# Patient Record
Sex: Female | Born: 1955 | Race: White | Hispanic: No | Marital: Married
Health system: Midwestern US, Academic
[De-identification: ages and names within clinical notes are randomized; demographics above are authoritative.]

---

## 2016-03-30 IMAGING — US US RETROPERITONEAL LIMITED
1 series · 14 of 25 positions shown · non-contrast
Comparison: none

[Series 1: us retroperitoneal limited · 14 of 48 slices shown]
[im 1/48]
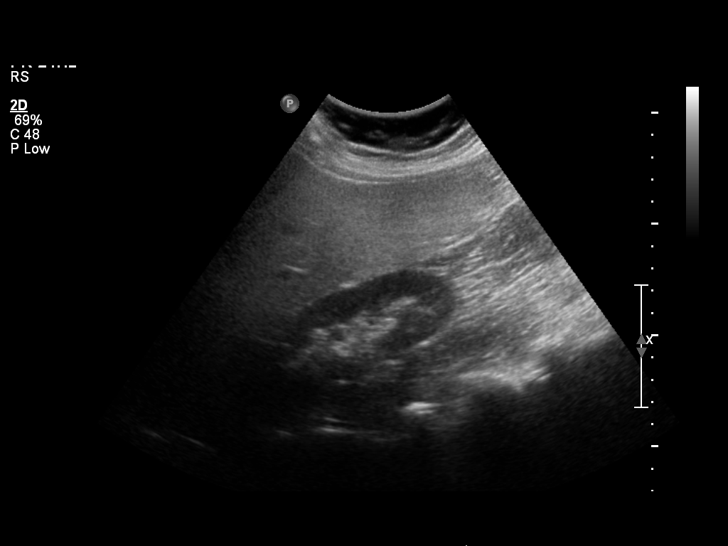
[im 4/48]
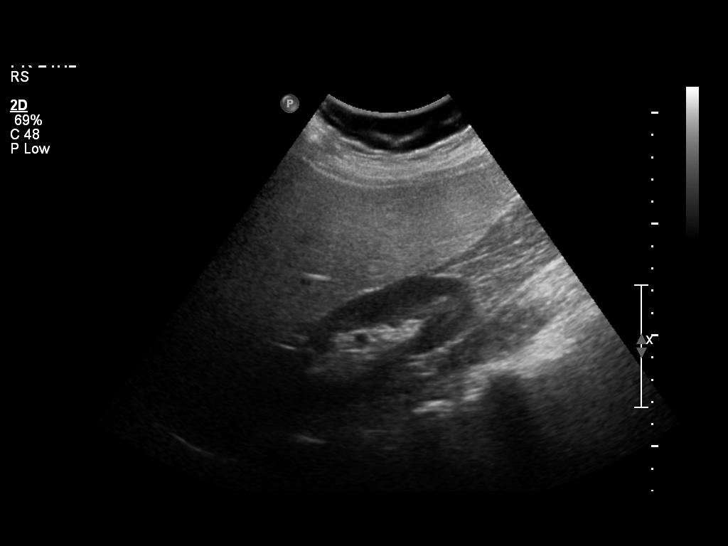
[im 8/48]
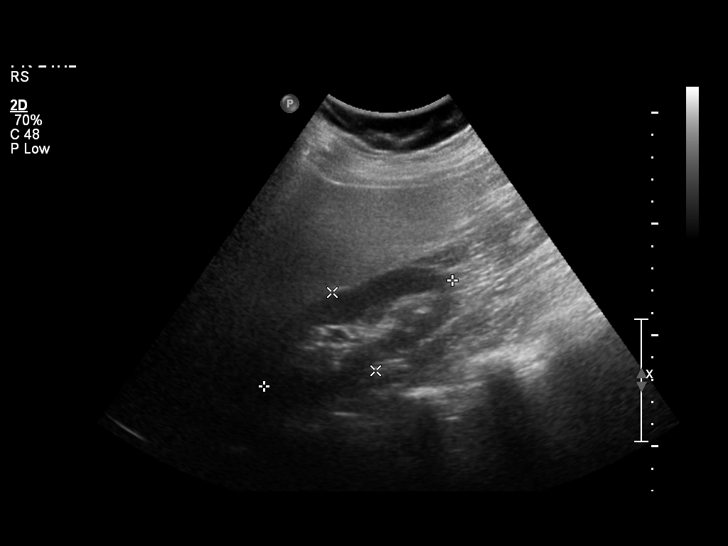
[im 12/48]
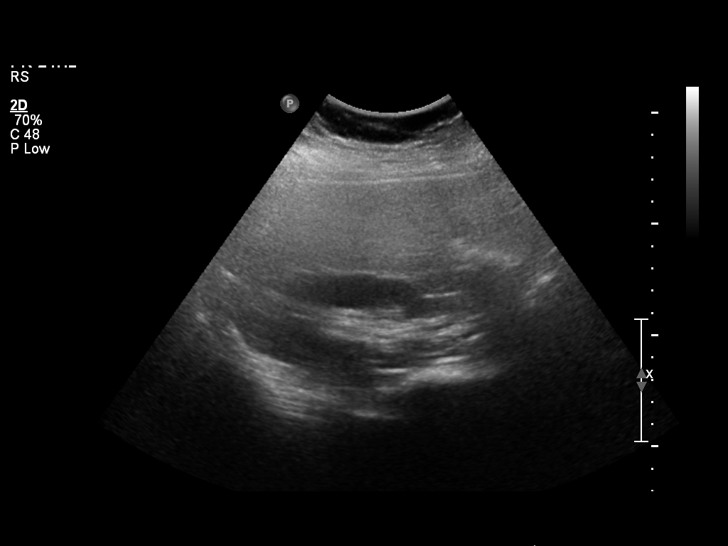
[im 16/48]
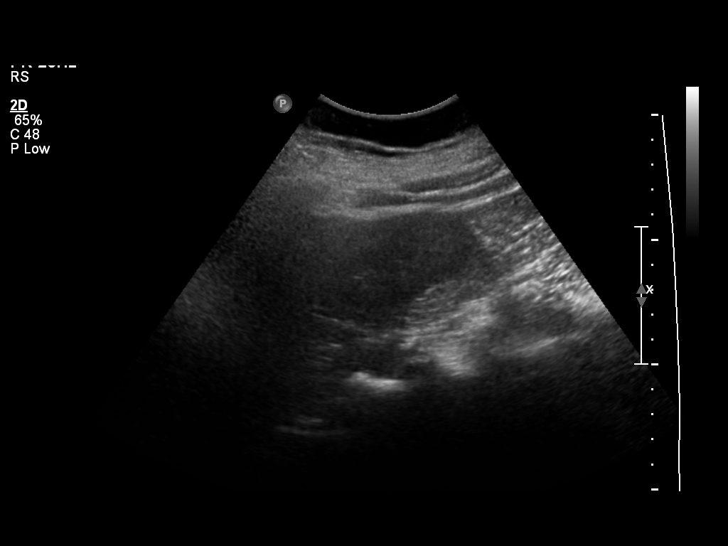
[im 18/48]
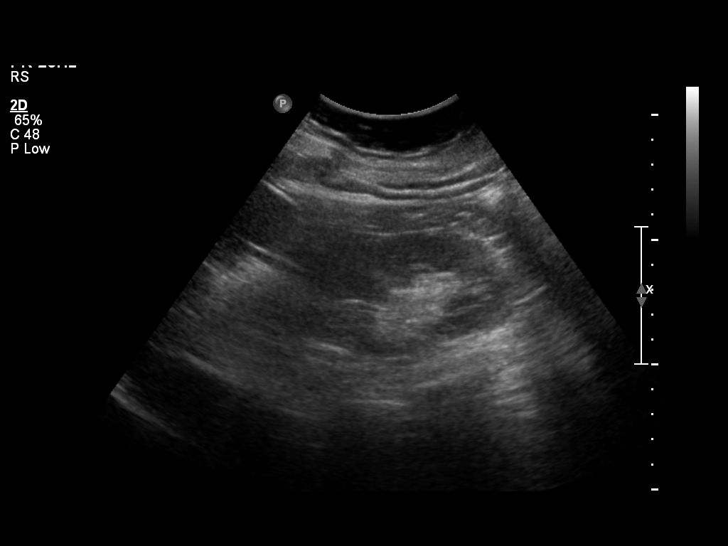
[im 22/48]
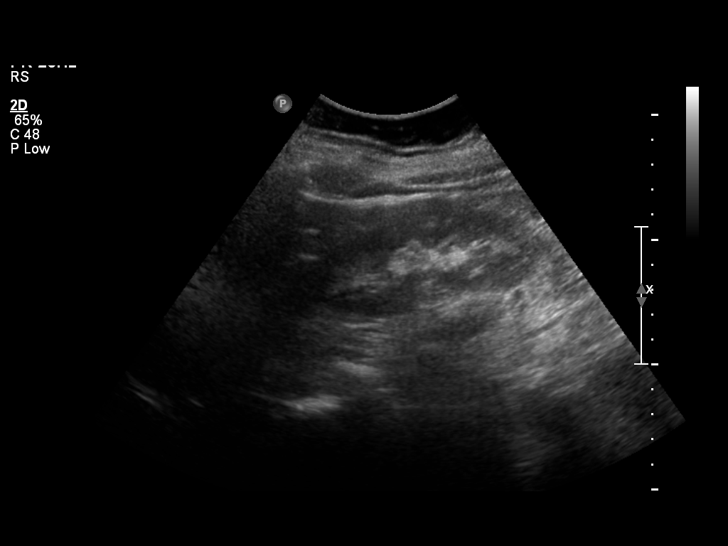
[im 26/48]
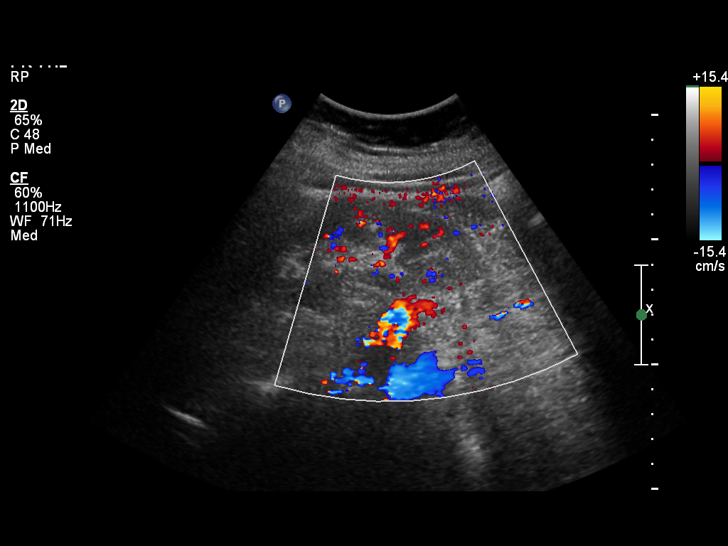
[im 30/48]
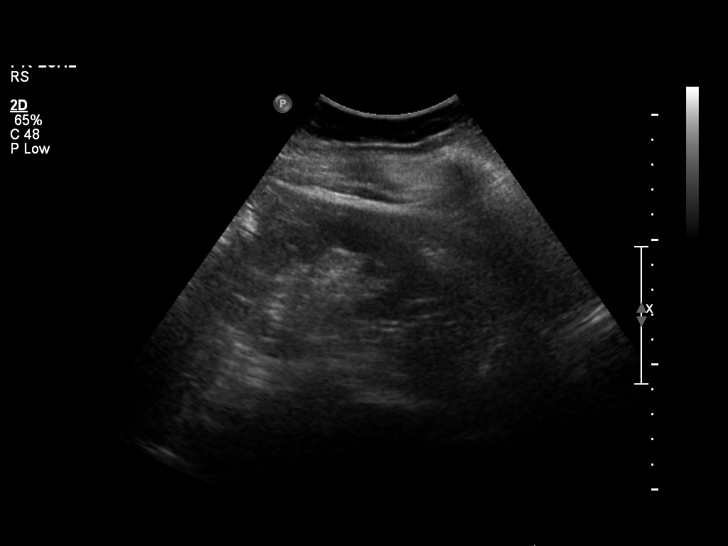
[im 32/48]
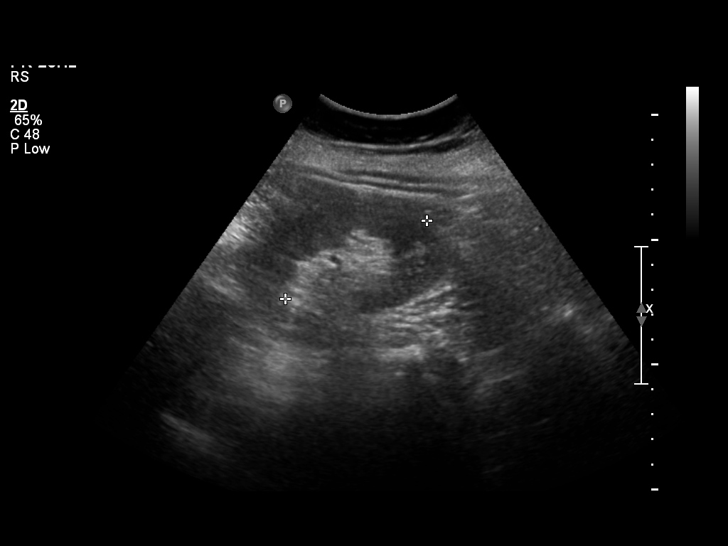
[im 36/48]
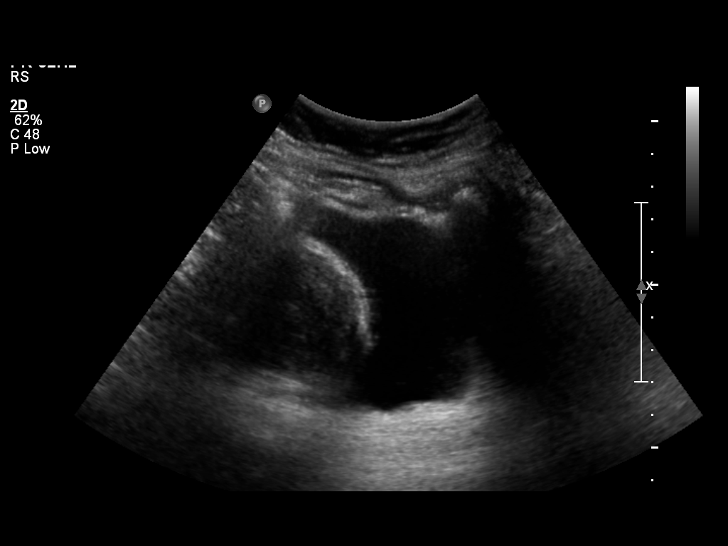
[im 40/48]
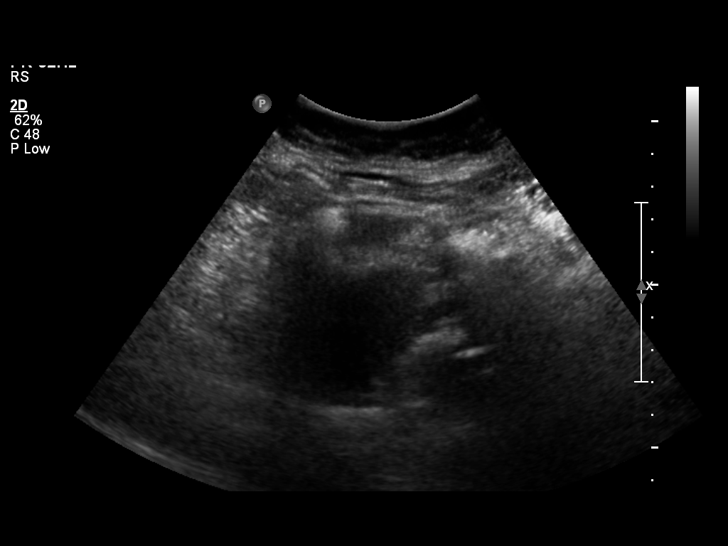
[im 44/48]
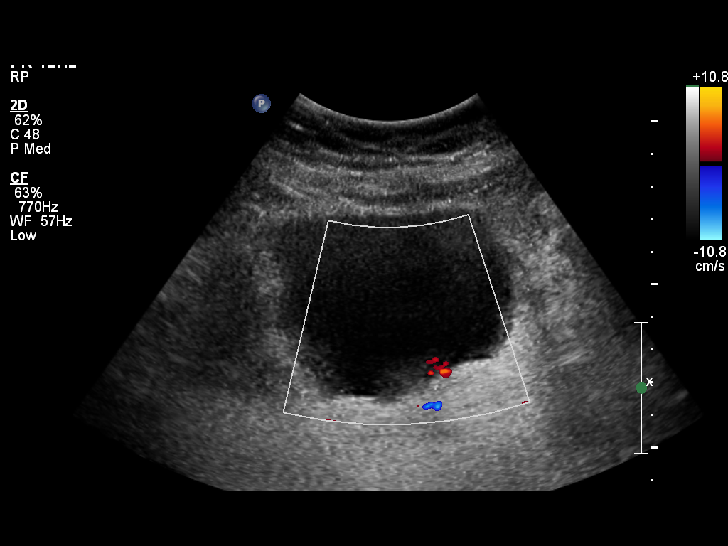
[im 48/48]
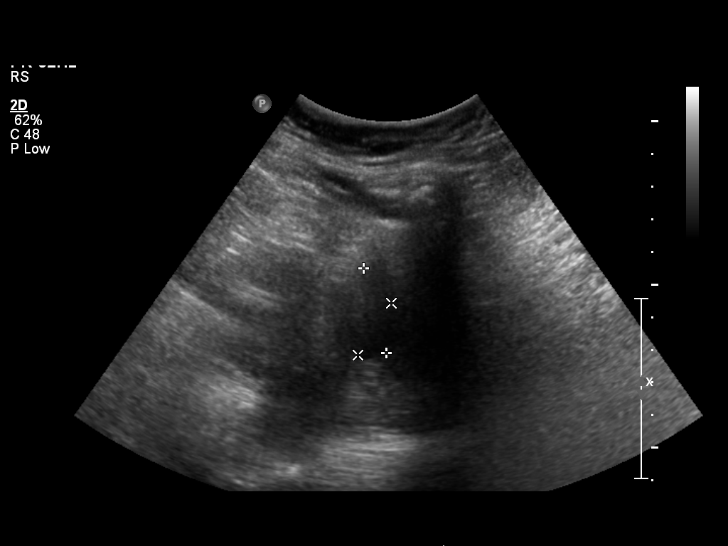

[14 of 25 positions shown; findings below may reference images not displayed]

ULTRASOUND REPORT

DIAGNOSTIC STUDIES

EXAM
Ultrasound kidneys and bladder

INDICATION
Hematuria. Bladder spasms

TECHNIQUE
Grayscale and color Doppler imaging of the kidneys and bladder was performed

COMPARISONS
None

FINDINGS
Right kidney 9.7 x 4.1 x 5.7 centimeter and left kidney 11.4 x 5.3 x 6.5 centimeter. Normal
thickness cortices. No hydronephrosis or shadowing nephrolithiasis.
The bladder is distended without focal mural nodularity or thickening. Prevoid volume 101
milliliter and postvoid volume 10 milliliter. Bilateral ureteral vesicular jets are evident.

IMPRESSION
No sonographic abnormalities of the kidneys or bladder.

## 2016-06-16 IMAGING — CR CHEST
2 series · 2 of 2 positions shown · non-contrast
Comparison: none

[chest pa x-wise]
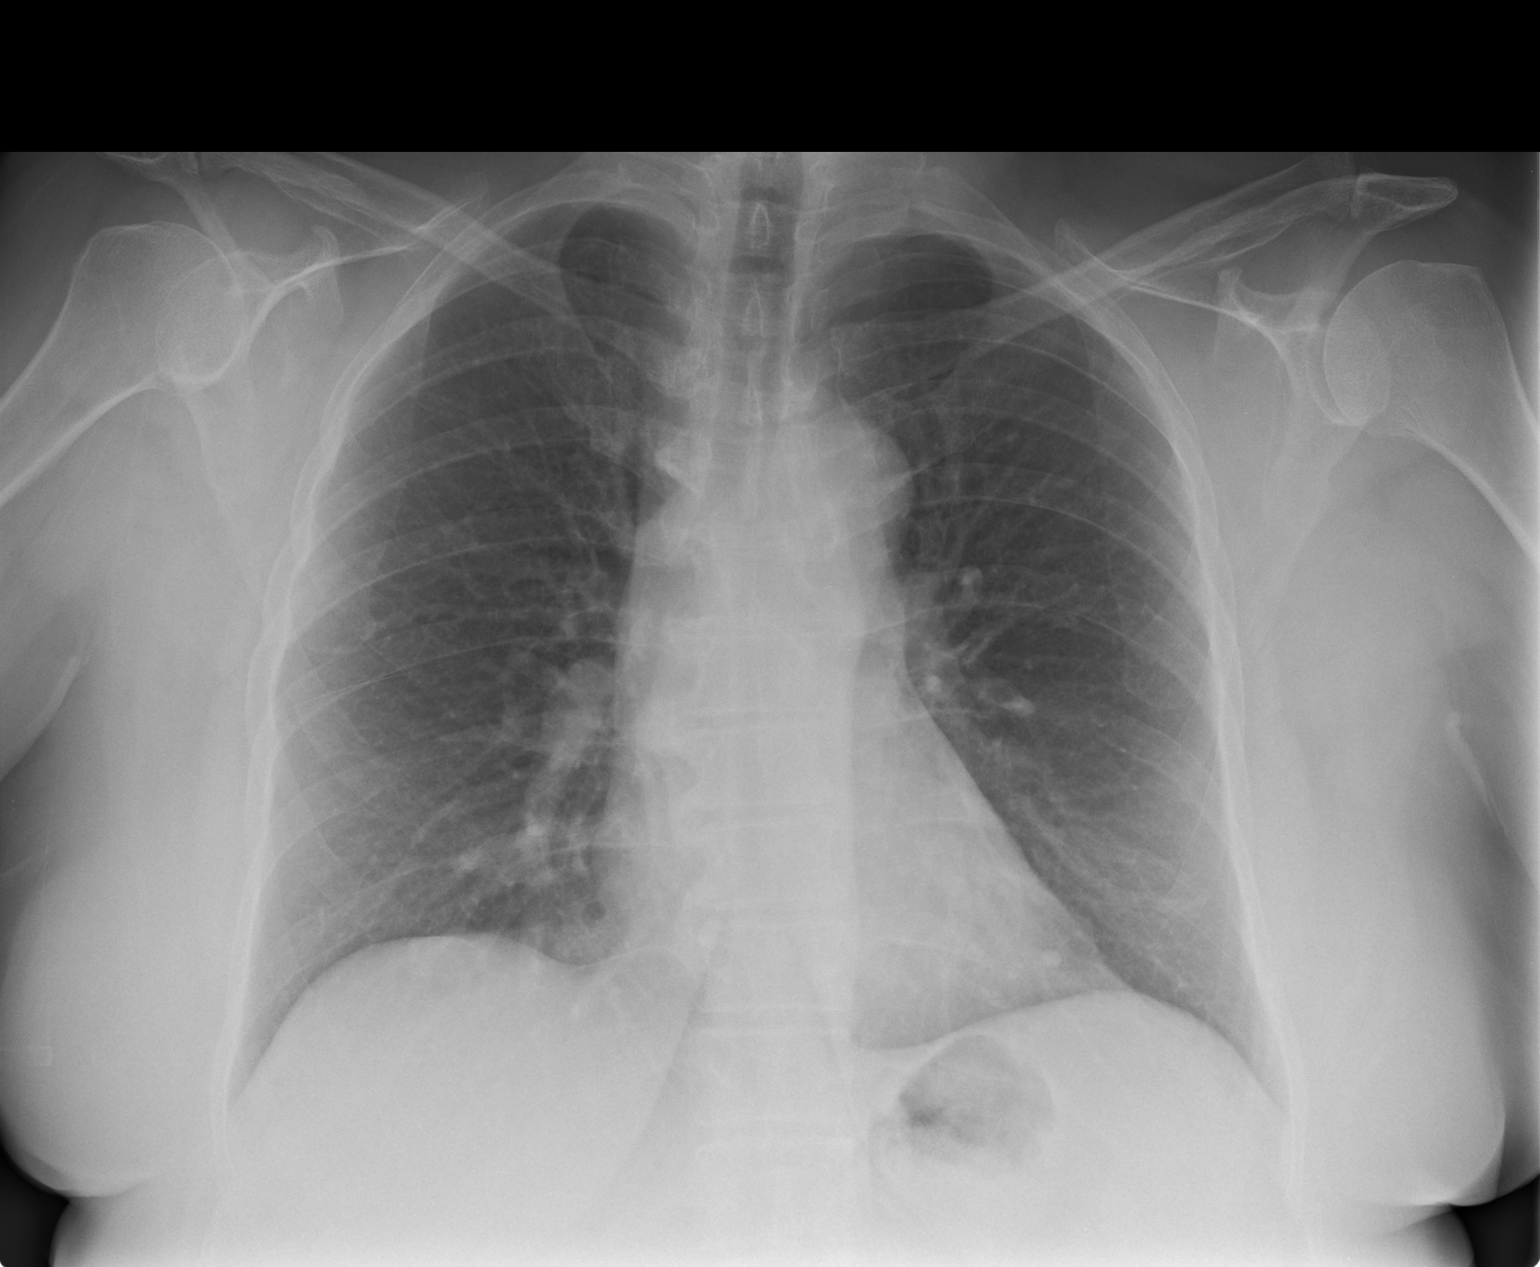

[chest lat]
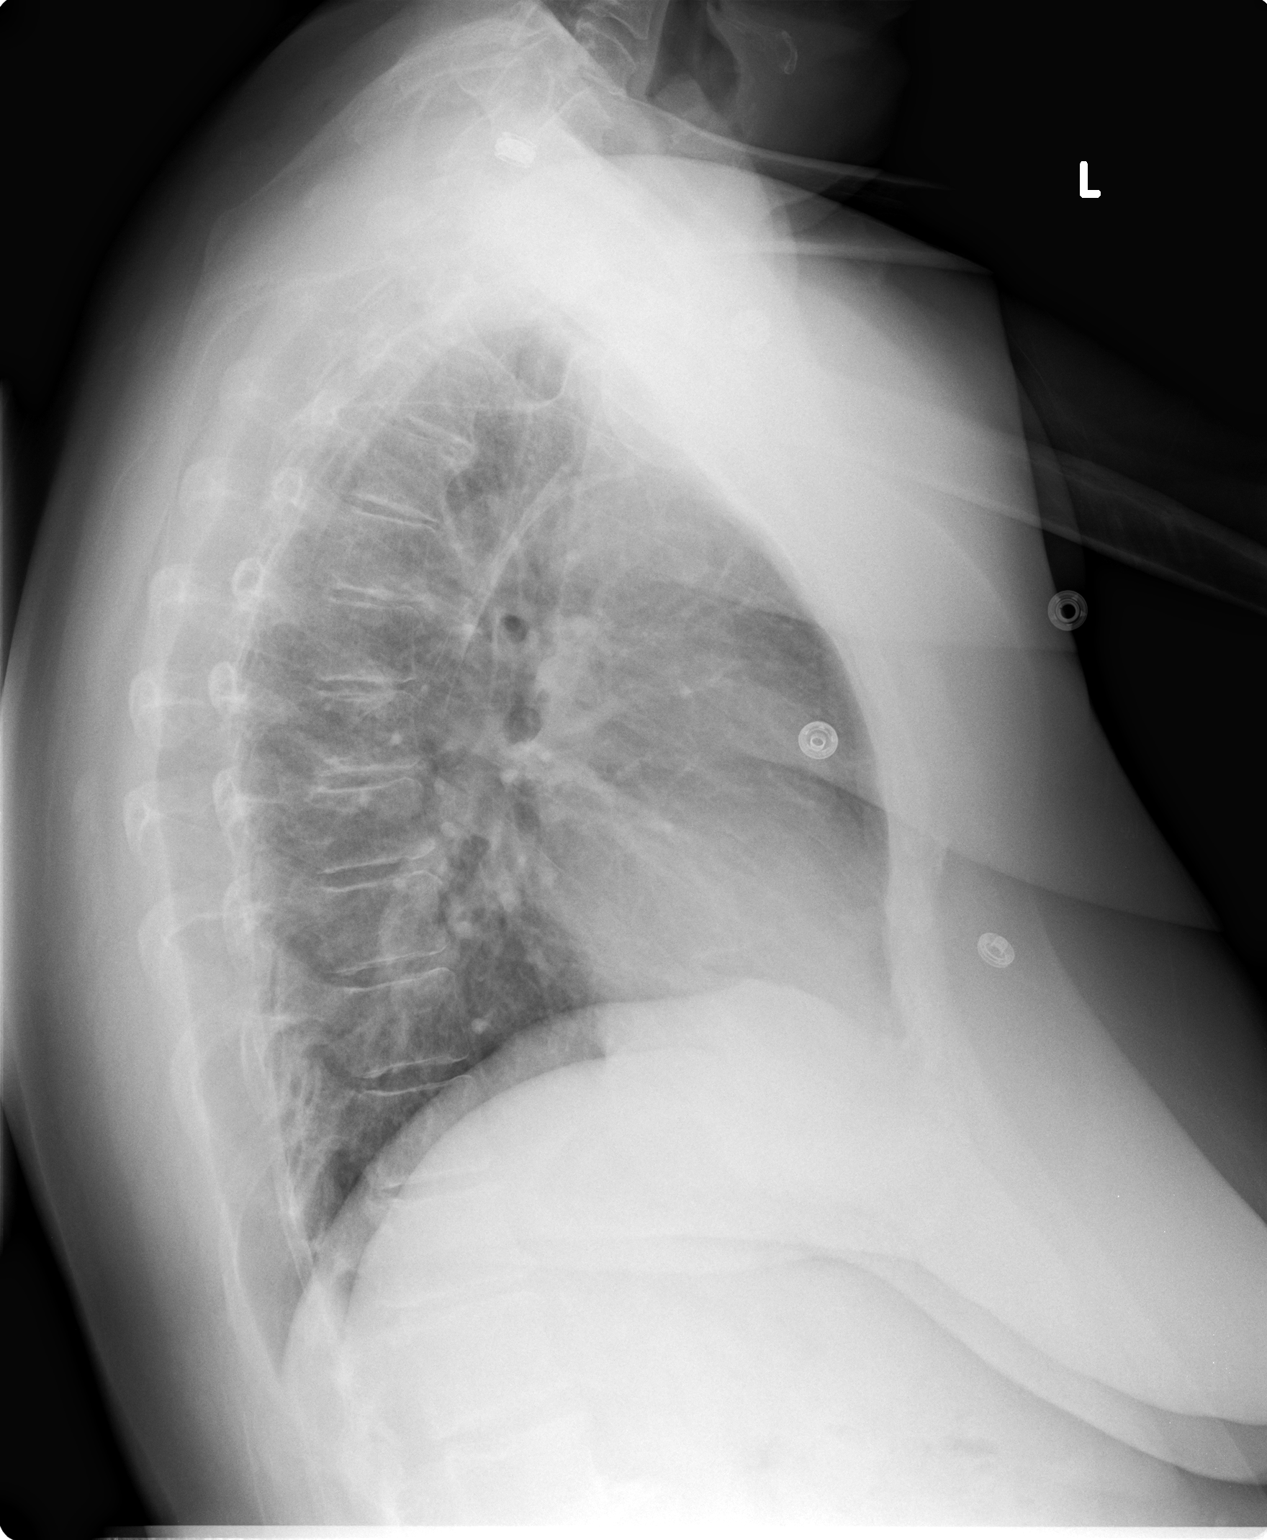

[2 of 2 positions shown; findings below may reference images not displayed]

EXAM

RADIOLOGICAL EXAMINATION, CHEST; 2 VIEWS FRONTAL AND LATERAL CPT 86343

INDICATION

cough fever
DYSPNEA W/FEVER AND CHILLS X 1 DAY. DENIES CHRONIC HEART OR LUNG
CONDITIONS. HB

TECHNIQUE

2 views of the chest were acquired.

COMPARISONS

None

FINDINGS

The cardiac silhouette is within normal limits. The lungs are clear. The pulmonary vasculature is
normal in caliber.

IMPRESSION

No acute cardiopulmonary process. Normal chest 2 views.

## 2016-08-15 IMAGING — CR CHEST
2 series · 2 of 2 positions shown · non-contrast
Comparison: none

[chest pa]
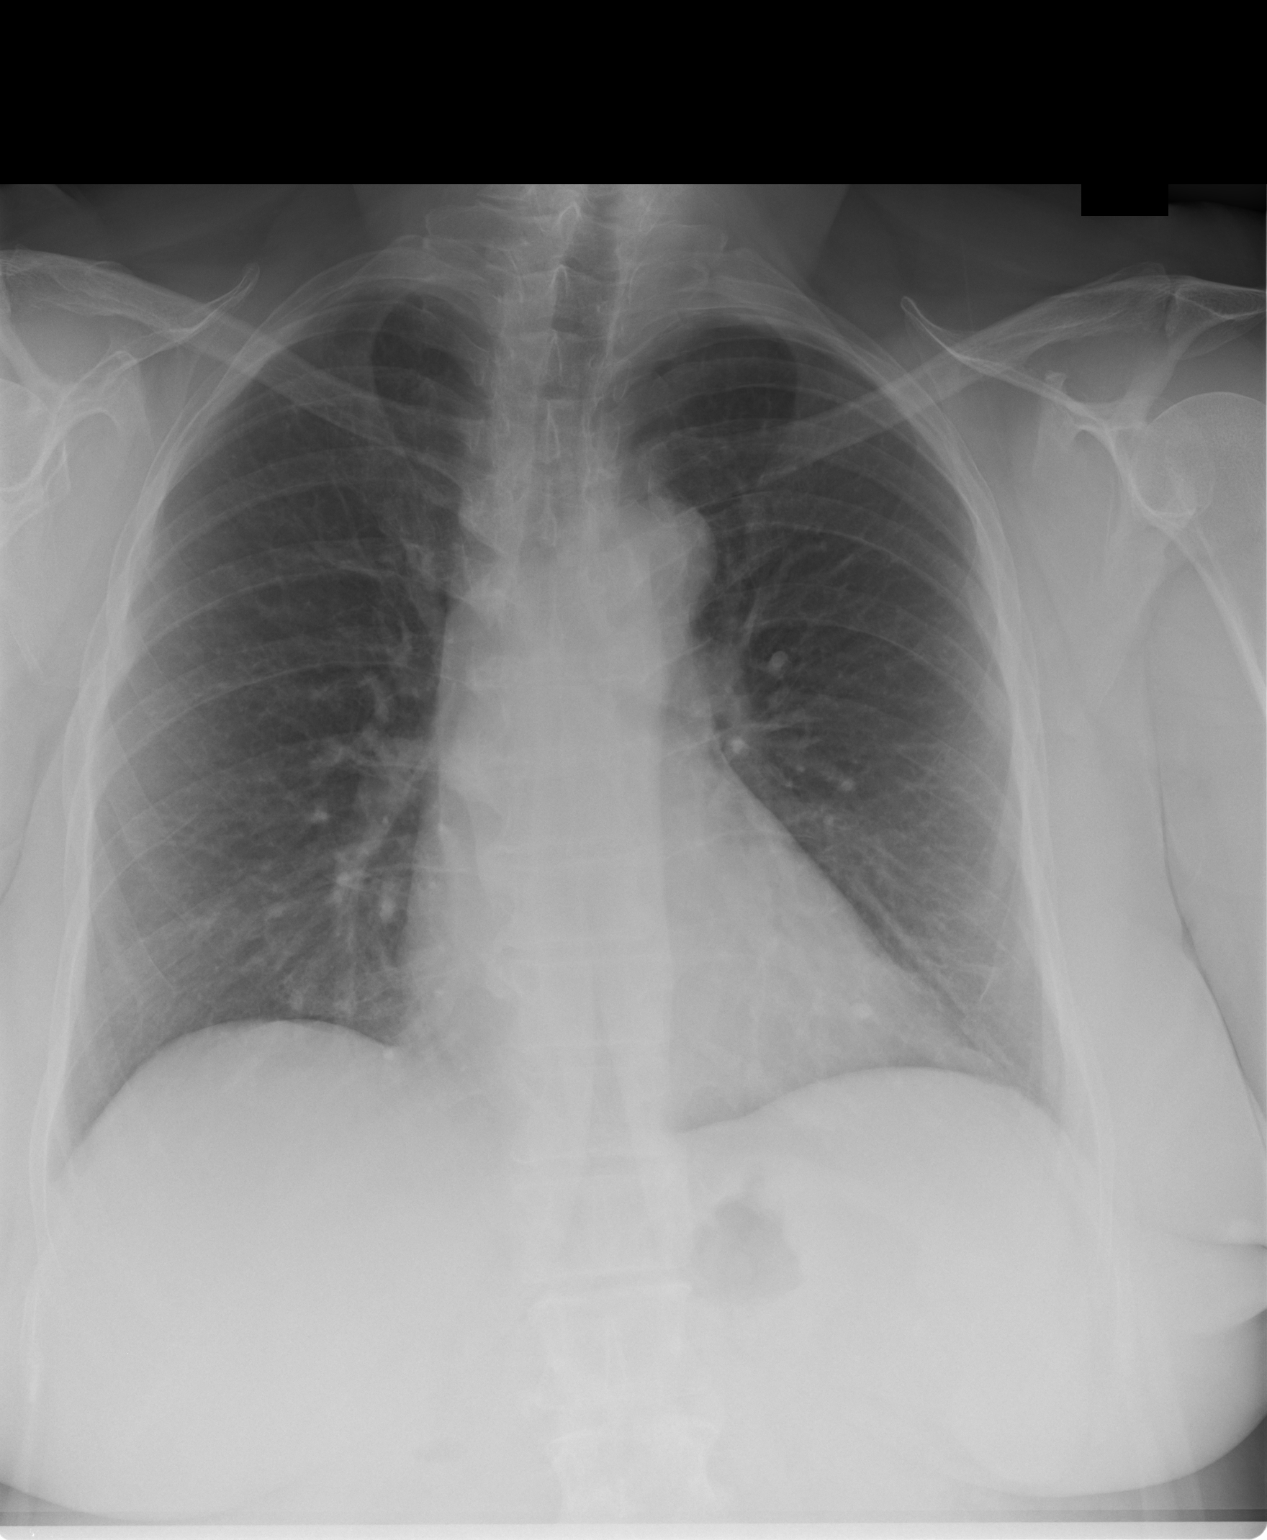

[chest lat]
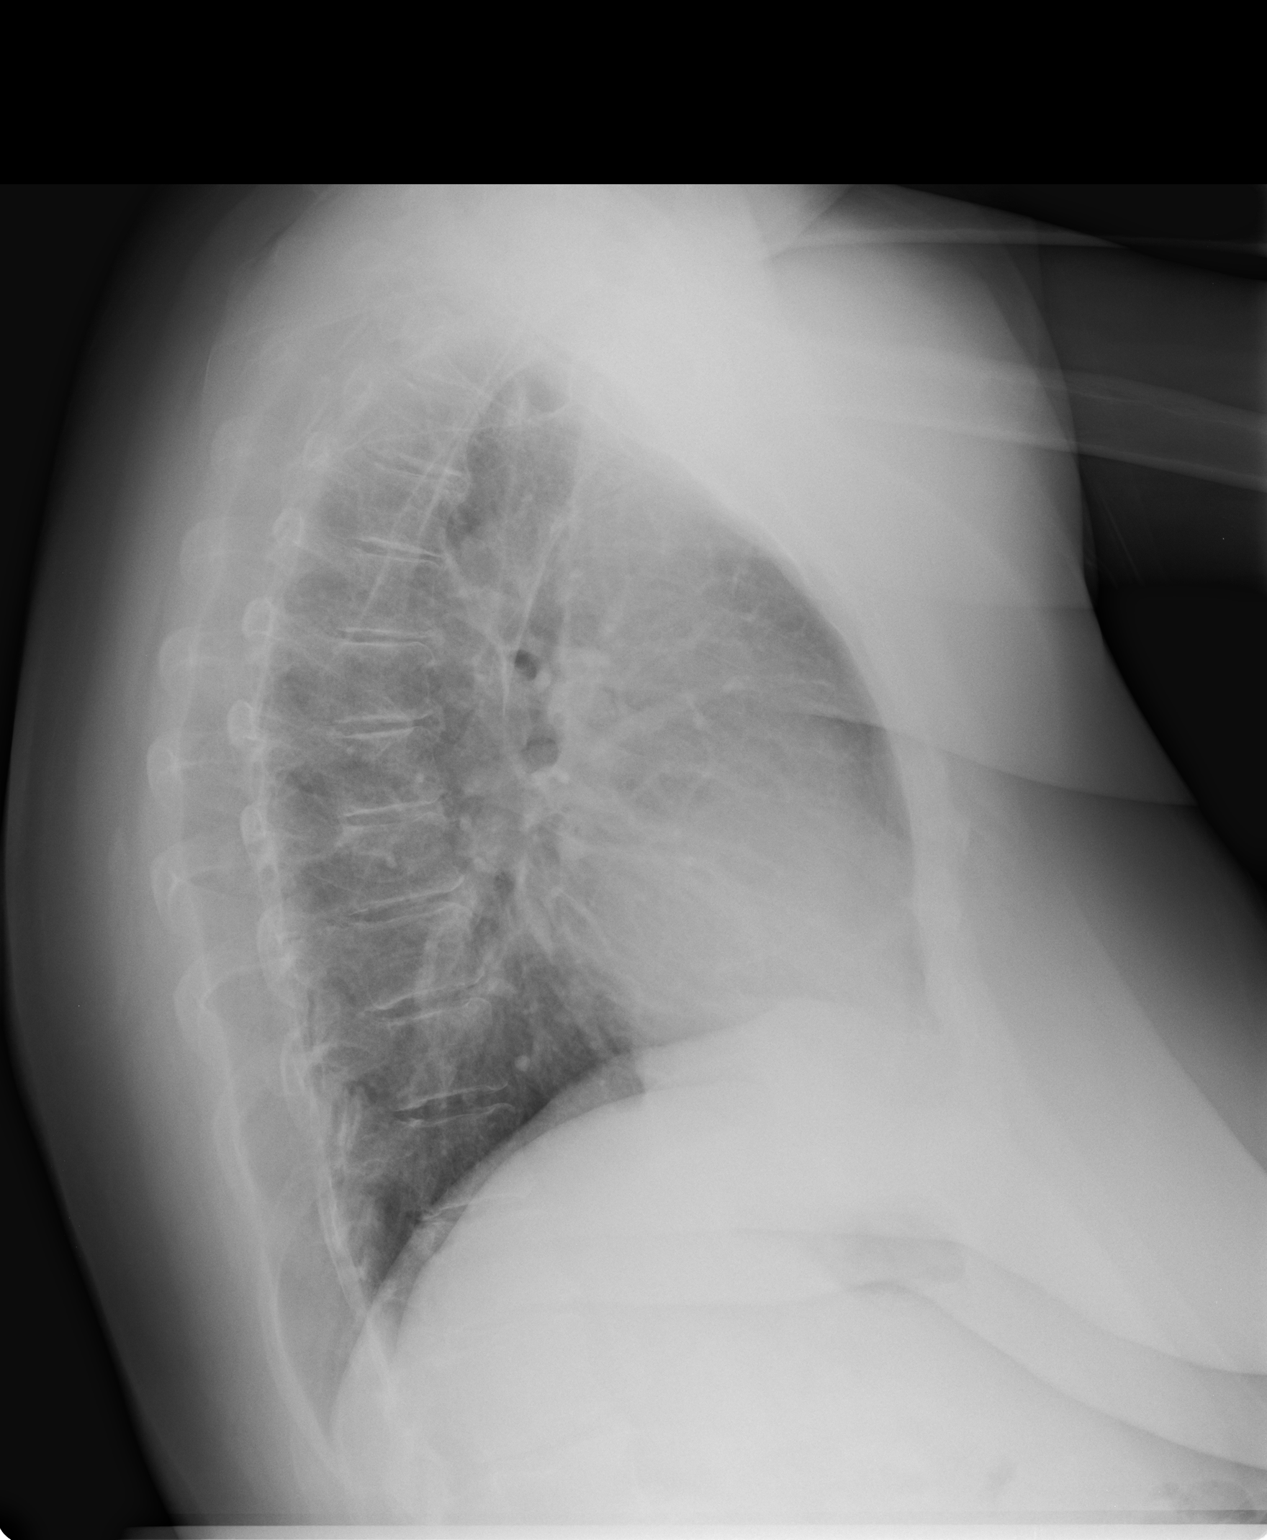

[2 of 2 positions shown; findings below may reference images not displayed]

EXAM
RADIOLOGICAL EXAMINATION, CHEST; 2 VIEWS FRONTAL AND LATERAL CPT 57787

INDICATION
SOB, MID BACK PAIN
PT STATES HAS SOA, COUGH. NO PRIOR CHEST HX. TJ/[REDACTED]

TECHNIQUE
2 views of the chest were acquired.

COMPARISONS
Previous examination dated 06/16/2016.

FINDINGS
The cardiac silhouette is within normal limits. The lungs are clear. The pulmonary vasculature is
normal in caliber.

IMPRESSION
No acute cardiopulmonary process. Normal chest 2 views.

## 2017-11-14 IMAGING — CR LOW_EXM
2 series · 2 of 2 positions shown · non-contrast
Comparison: none

[knee ap]
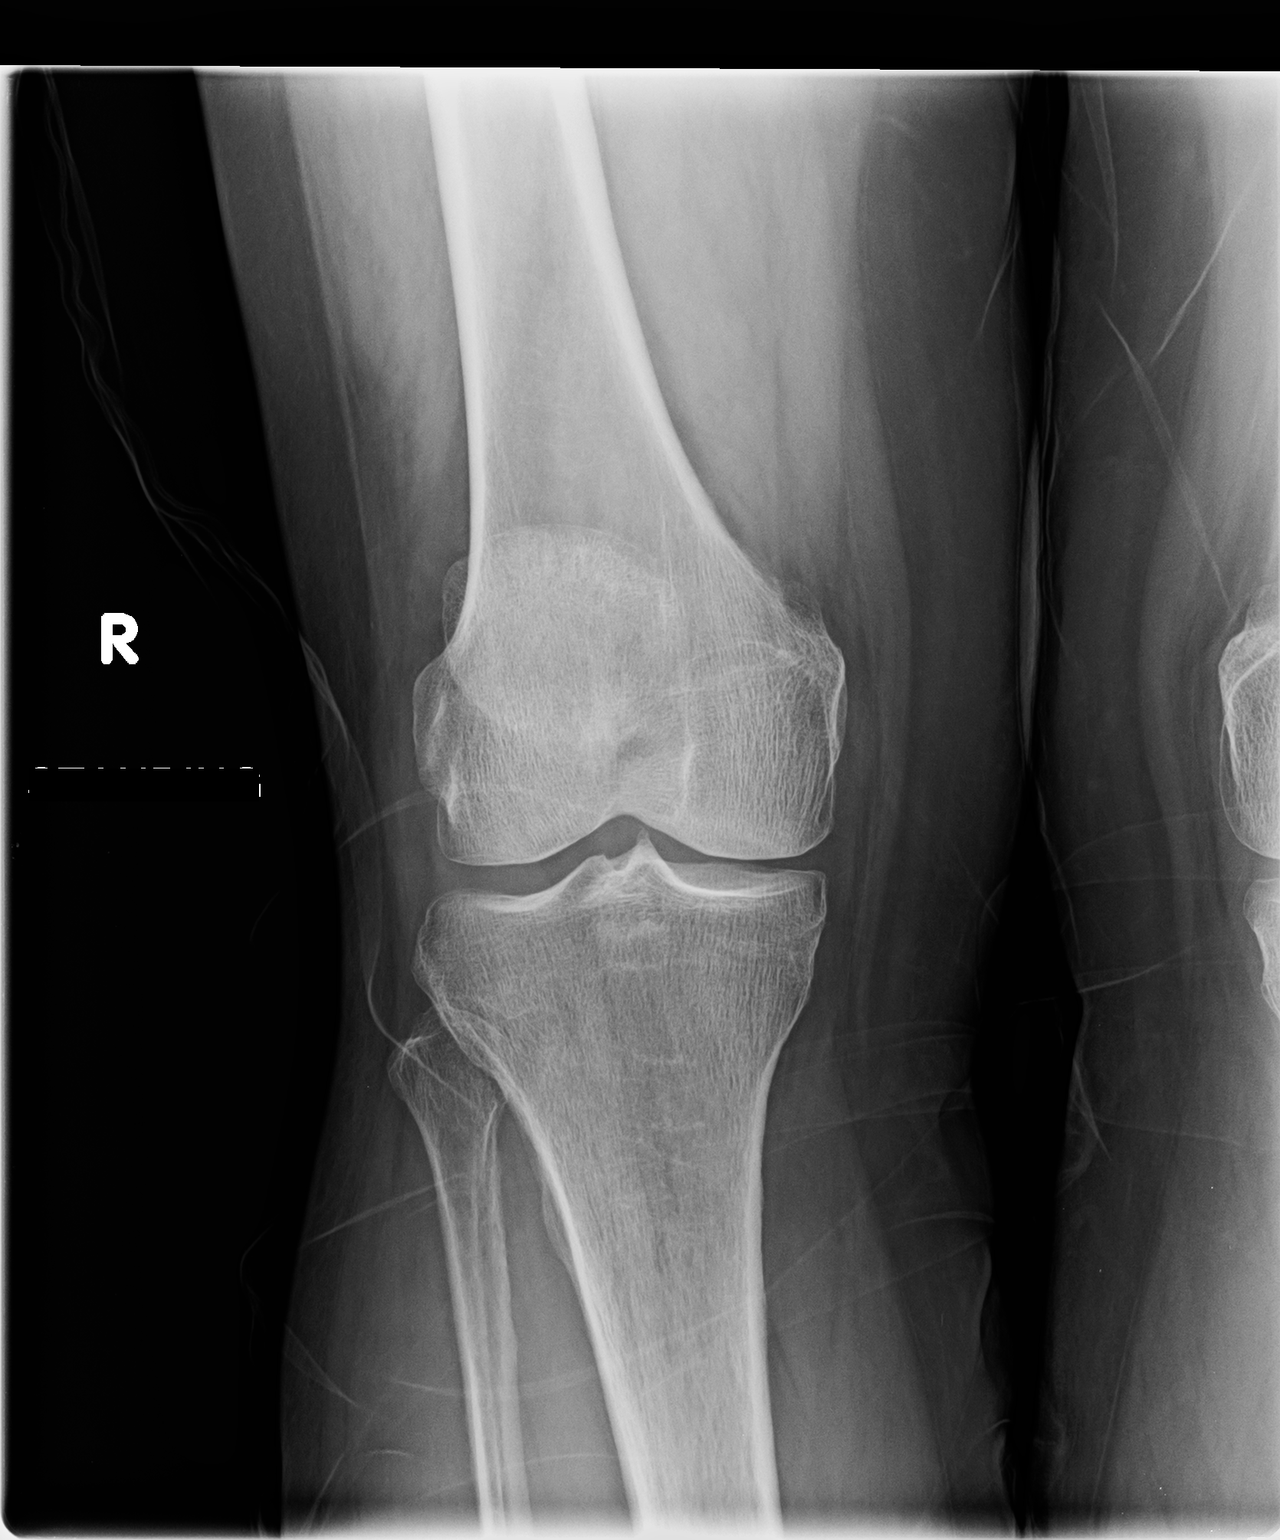

[knee lat]
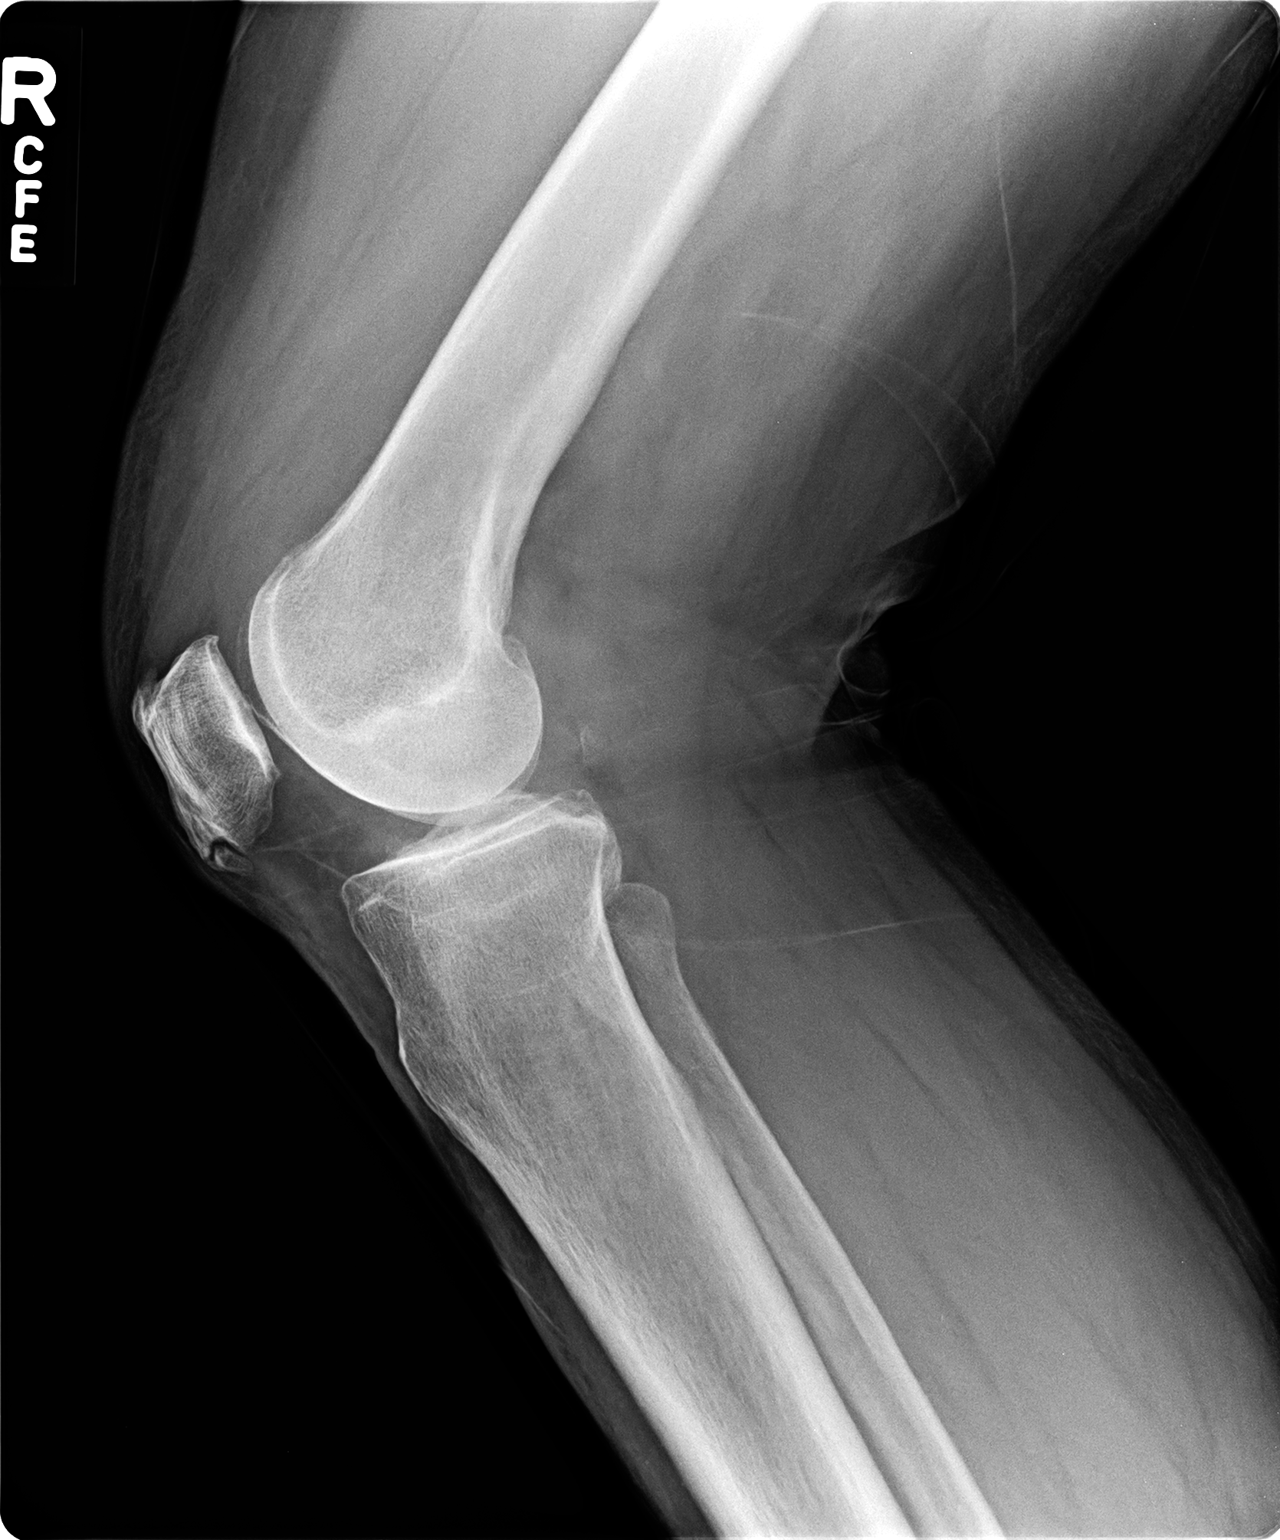

[2 of 2 positions shown; findings below may reference images not displayed]

DIAGNOSTIC STUDIES

EXAM

Right knee radiographs.

INDICATION

RT KNEE PAIN
PT C/O RIGHT KNEE PAIN. NKI. TJ/CE

TECHNIQUE

AP standing and lateral views of the right knee.

COMPARISONS

None.

FINDINGS

There is mild joint space narrowing of the medial compartment with slight marginal spurring of the
anterior compartment also noted. There is evidence of a small to moderate joint effusion.
Enthesopathy at the superior and inferior patella with partial fragmentation inferiorly, though
corticated margins suggesting that this is a chronic process. There is no gross disruption of the
extensor mechanism of the knee. No compelling evidence acutely displaced fracture. There is a small
sessile osteochondroma at the medial aspect of the distal femoral metaphysis.

IMPRESSION

No acute osseous abnormality. Joint effusion. Mild degenerative changes.

## 2018-03-03 ENCOUNTER — Ambulatory Visit: Admit: 2018-03-03 | Discharge: 2018-03-03 | Payer: BC Managed Care – HMO

## 2018-03-03 ENCOUNTER — Encounter: Admit: 2018-03-03 | Discharge: 2018-03-03 | Payer: BC Managed Care – HMO

## 2018-03-03 DIAGNOSIS — E119 Type 2 diabetes mellitus without complications: Principal | ICD-10-CM

## 2018-03-03 DIAGNOSIS — O009 Unspecified ectopic pregnancy without intrauterine pregnancy: ICD-10-CM

## 2018-03-03 DIAGNOSIS — F419 Anxiety disorder, unspecified: ICD-10-CM

## 2018-03-03 DIAGNOSIS — R030 Elevated blood-pressure reading, without diagnosis of hypertension: ICD-10-CM

## 2018-03-03 MED ORDER — METFORMIN 500 MG PO TAB
500 mg | ORAL_TABLET | Freq: Two times a day (BID) | ORAL | 0 refills | Status: AC
Start: 2018-03-03 — End: 2018-06-04

## 2018-03-03 MED ORDER — BLOOD SUGAR DIAGNOSTIC MISC STRP
1 | ORAL_STRIP | Freq: Before meals | 3 refills | 30.00000 days | Status: AC
Start: 2018-03-03 — End: ?

## 2018-03-17 LAB — MICROALBUMIN-URINE RANDOM
Lab: 10 mg/g (ref <30)
Lab: 169 mg/dL — AB (ref 47–110)
Lab: 18 mg/L — ABNORMAL LOW (ref >60)

## 2018-03-18 ENCOUNTER — Encounter: Admit: 2018-03-18 | Discharge: 2018-03-18 | Payer: BC Managed Care – HMO

## 2018-03-18 DIAGNOSIS — E119 Type 2 diabetes mellitus without complications: Principal | ICD-10-CM

## 2018-03-19 ENCOUNTER — Encounter: Admit: 2018-03-19 | Discharge: 2018-03-19 | Payer: BC Managed Care – HMO

## 2018-03-19 DIAGNOSIS — E119 Type 2 diabetes mellitus without complications: Principal | ICD-10-CM

## 2018-03-19 LAB — CBC
Lab: 14 g/dL — ABNORMAL LOW (ref 12.0–16.0)
Lab: 226 x10-3/uL — ABNORMAL LOW (ref 130–400)
Lab: 26 pg — ABNORMAL LOW (ref 27.0–31.0)
Lab: 47 % — ABNORMAL HIGH (ref 37.0–47.00)
Lab: 5.5 10*6/uL — ABNORMAL HIGH (ref 4.20–5.40)
Lab: 6.2 x10-3/uL (ref 4.8–10.8)
Lab: 86 fL (ref 80.0–99.0)

## 2018-03-19 LAB — COMPREHENSIVE METABOLIC PANEL
Lab: 0.8 mg/dL (ref 0.57–1.11)
Lab: 106 mmol/L (ref 98–107)
Lab: 12 mg/dL (ref 9.8–20.1)
Lab: 140 mmol/L — ABNORMAL HIGH (ref <100)
Lab: 162 mg/dL — ABNORMAL HIGH (ref 80–115)
Lab: 23 mmol/L (ref 23–31)
Lab: 4.3 mmol/L (ref 3.5–5.1)

## 2018-03-19 LAB — LIPID PROFILE
Lab: 152 mg/dL (ref 30–200)
Lab: 207 mg/dL — ABNORMAL HIGH (ref 150–200)
Lab: 43 mg/dL (ref 35–60)

## 2018-03-19 LAB — TSH WITH FREE T4 REFLEX: Lab: 1.6 m[IU]/mL (ref >60)

## 2018-06-03 ENCOUNTER — Encounter: Admit: 2018-06-03 | Discharge: 2018-06-03 | Payer: BC Managed Care – HMO

## 2018-06-03 ENCOUNTER — Ambulatory Visit: Admit: 2018-06-03 | Discharge: 2018-06-04 | Payer: BC Managed Care – HMO

## 2018-06-03 DIAGNOSIS — O009 Unspecified ectopic pregnancy without intrauterine pregnancy: ICD-10-CM

## 2018-06-03 DIAGNOSIS — E119 Type 2 diabetes mellitus without complications: Principal | ICD-10-CM

## 2018-06-03 MED ORDER — SITAGLIPTIN-METFORMIN 50-1,000 MG PO TM24
2 | ORAL_TABLET | Freq: Every day | ORAL | 3 refills | Status: AC
Start: 2018-06-03 — End: 2018-10-31

## 2018-06-04 DIAGNOSIS — E119 Type 2 diabetes mellitus without complications: Principal | ICD-10-CM

## 2018-06-04 DIAGNOSIS — E782 Mixed hyperlipidemia: ICD-10-CM

## 2018-10-31 ENCOUNTER — Ambulatory Visit: Admit: 2018-10-31 | Discharge: 2018-11-01 | Payer: BC Managed Care – HMO

## 2018-10-31 ENCOUNTER — Encounter: Admit: 2018-10-31 | Discharge: 2018-10-31 | Payer: BC Managed Care – HMO

## 2018-10-31 DIAGNOSIS — E119 Type 2 diabetes mellitus without complications: Secondary | ICD-10-CM

## 2018-10-31 DIAGNOSIS — O009 Unspecified ectopic pregnancy without intrauterine pregnancy: Secondary | ICD-10-CM

## 2018-10-31 MED ORDER — SITAGLIPTIN-METFORMIN 50-1,000 MG PO TM24
2 | ORAL_TABLET | Freq: Every day | ORAL | 3 refills | Status: AC
Start: 2018-10-31 — End: 2019-11-11

## 2018-11-01 DIAGNOSIS — E119 Type 2 diabetes mellitus without complications: Secondary | ICD-10-CM

## 2018-11-01 DIAGNOSIS — Z1211 Encounter for screening for malignant neoplasm of colon: Secondary | ICD-10-CM

## 2018-11-04 ENCOUNTER — Encounter: Admit: 2018-11-04 | Discharge: 2018-11-04 | Payer: BC Managed Care – HMO

## 2018-11-04 DIAGNOSIS — O009 Unspecified ectopic pregnancy without intrauterine pregnancy: Secondary | ICD-10-CM

## 2018-11-04 DIAGNOSIS — E119 Type 2 diabetes mellitus without complications: Secondary | ICD-10-CM

## 2018-12-05 ENCOUNTER — Ambulatory Visit: Admit: 2018-12-05 | Discharge: 2018-12-05 | Payer: BC Managed Care – HMO

## 2018-12-16 IMAGING — CR CHEST
2 series · 2 of 2 positions shown · non-contrast
Comparison: none

[chest pa]
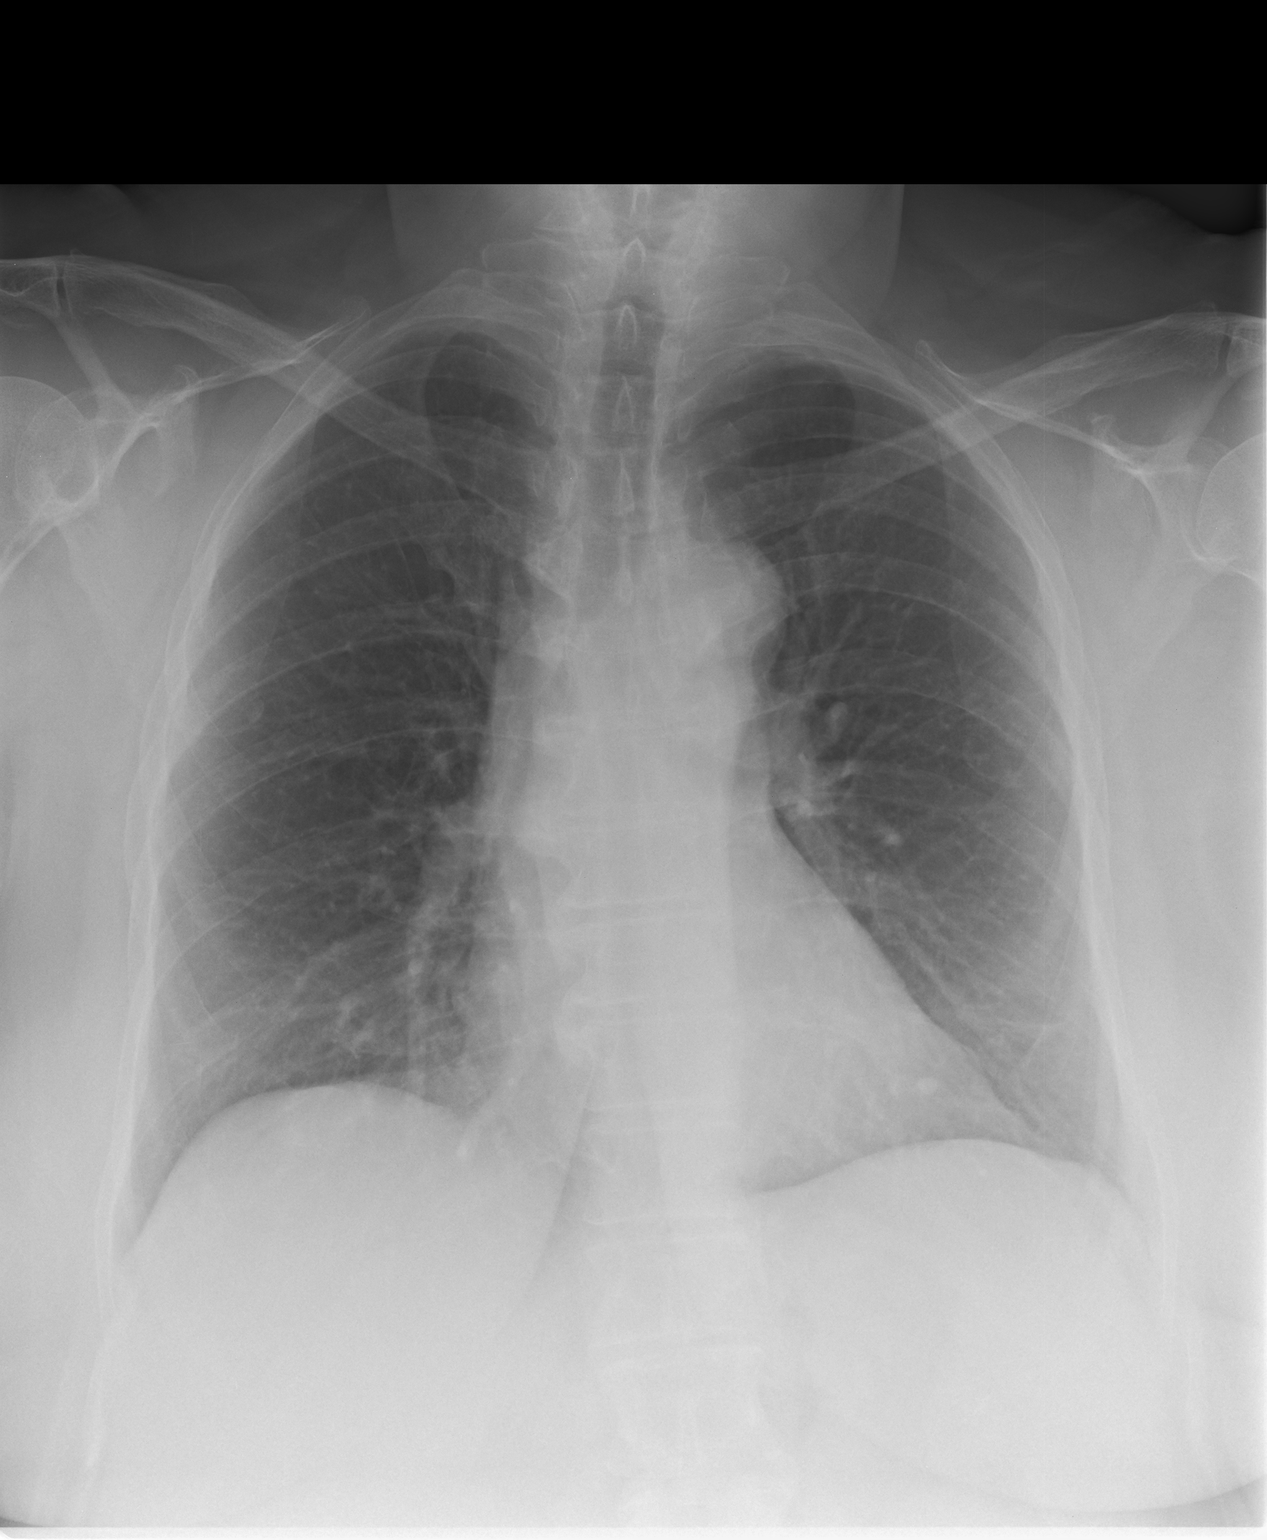

[chest lat]
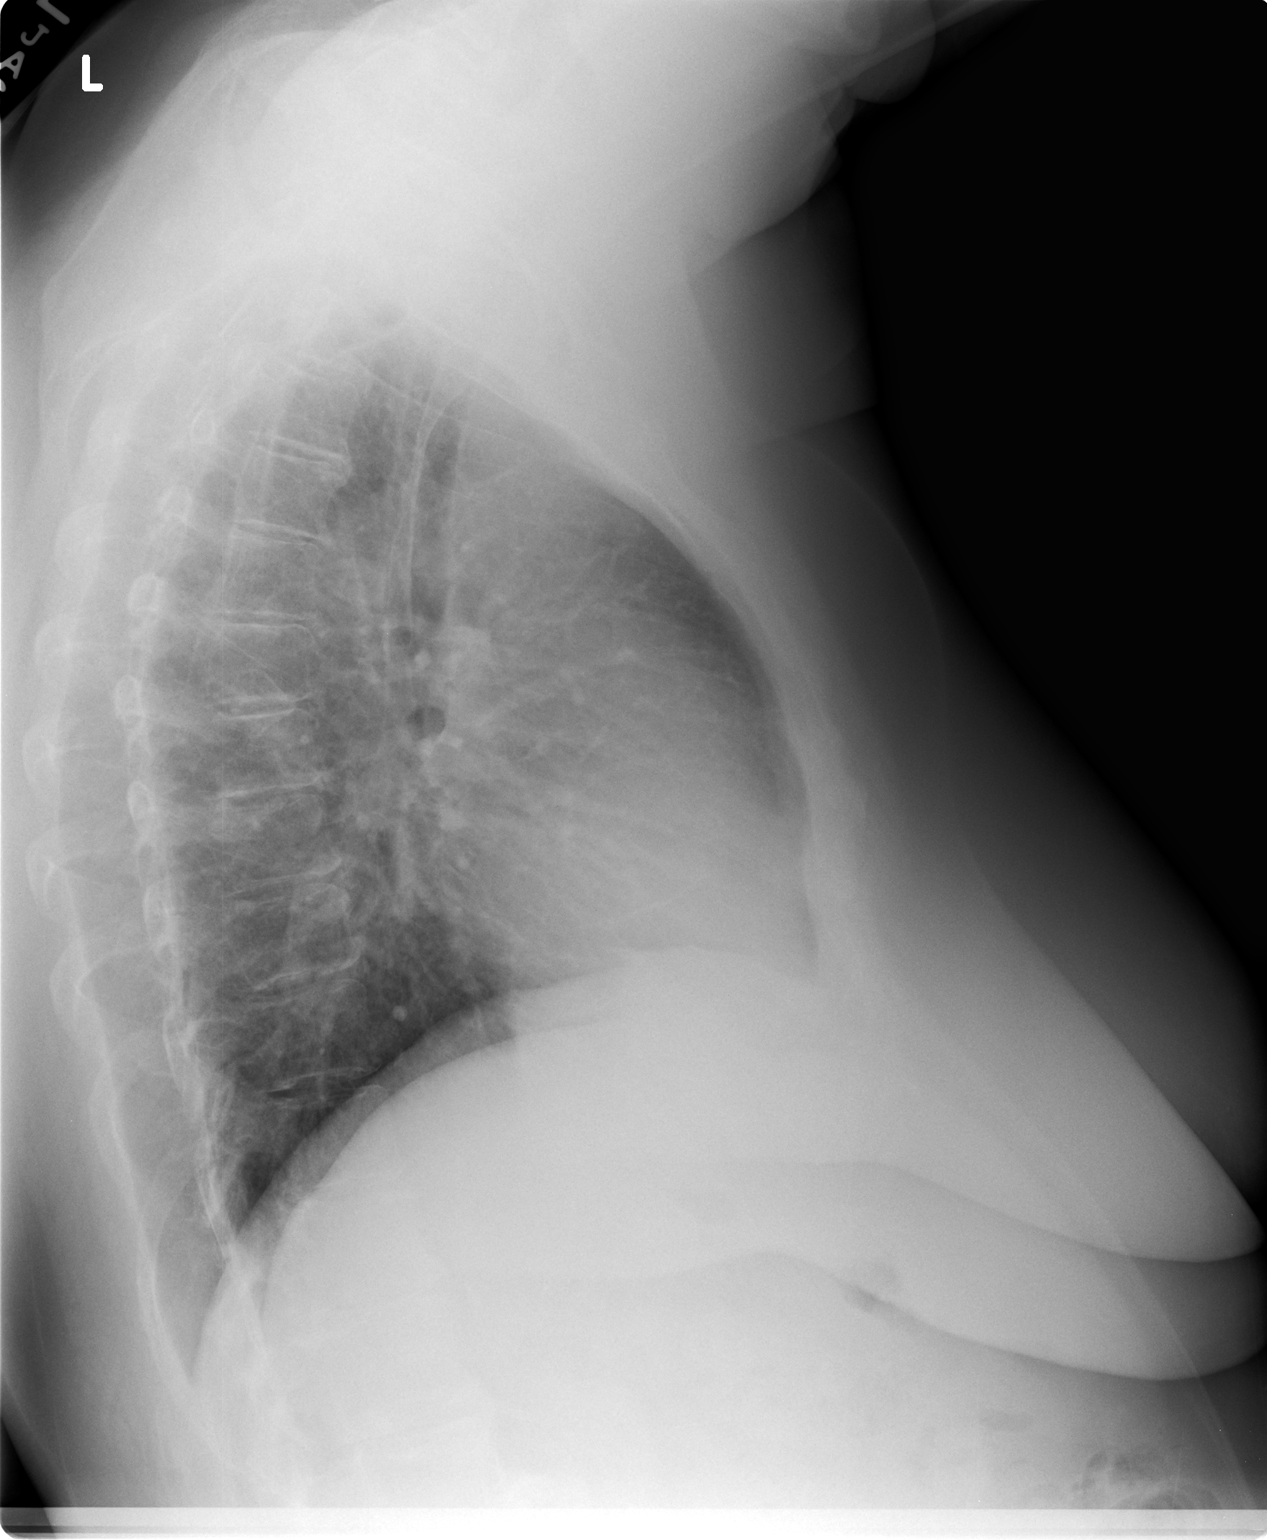

[2 of 2 positions shown; findings below may reference images not displayed]

DIAGNOSTIC STUDIES

EXAM

RADIOLOGICAL EXAMINATION, CHEST; 2 VIEWS FRONTAL AND LATERAL CPT 42909

INDICATION

soa
PT c/o SOA   cough x 1 month. JC/AB

TECHNIQUE

Frontal and lateral views of the chest were performed.

COMPARISONS

06/16/2016

FINDINGS

The cardiac and mediastinal contours are unremarkable. There is no evidence of acute consolidation,
congestion, or pleural effusions.  No acute displaced fracture.

IMPRESSION

Mild peribronchial thickening. No dense consolidation.

Follow up with CT can be performed if the patient is high risk (smoker, history of malignancy).

Tech Notes:

PT c/o SOA   cough x 1 month. JC/AB

## 2018-12-17 ENCOUNTER — Encounter: Admit: 2018-12-17 | Discharge: 2018-12-17 | Payer: BC Managed Care – HMO

## 2018-12-18 DIAGNOSIS — Z1211 Encounter for screening for malignant neoplasm of colon: Principal | ICD-10-CM

## 2018-12-18 IMAGING — US ABDCM
1 series · 14 of 25 positions shown · non-contrast
Comparison: none

[Series 1: us abdomen complete · 14 of 55 slices shown]
[im 1/55]
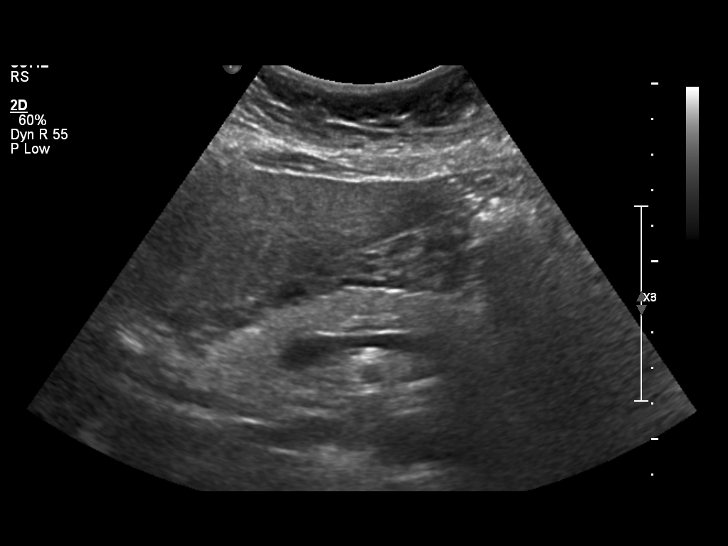
[im 5/55]
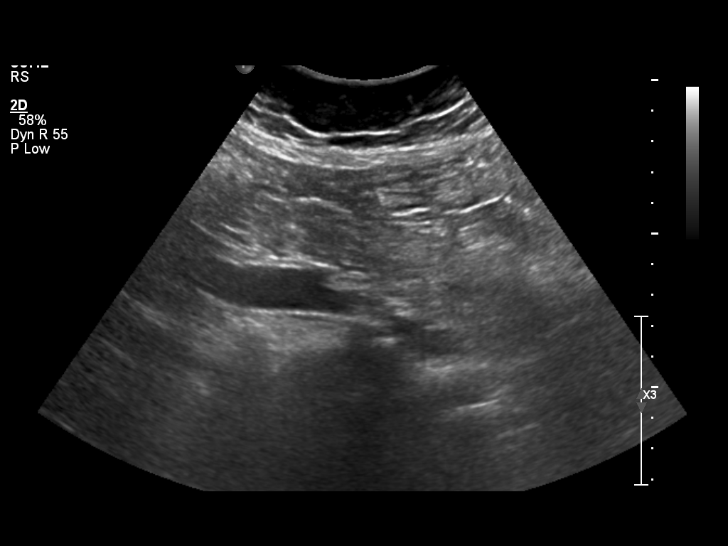
[im 10/55]
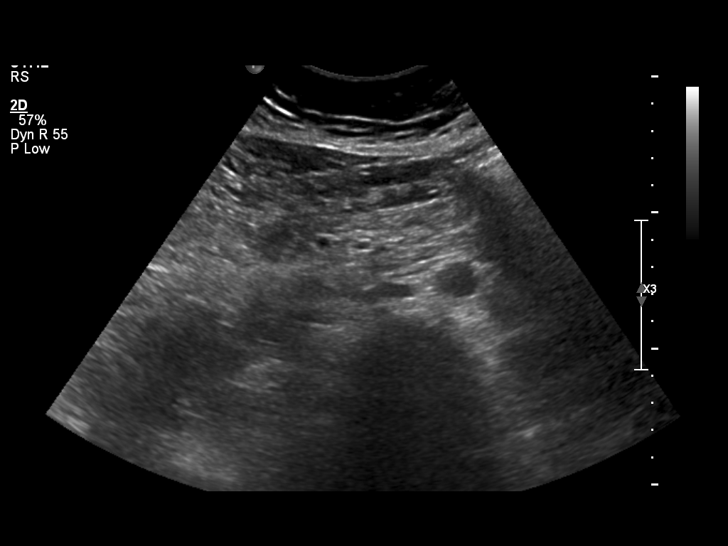
[im 14/55]
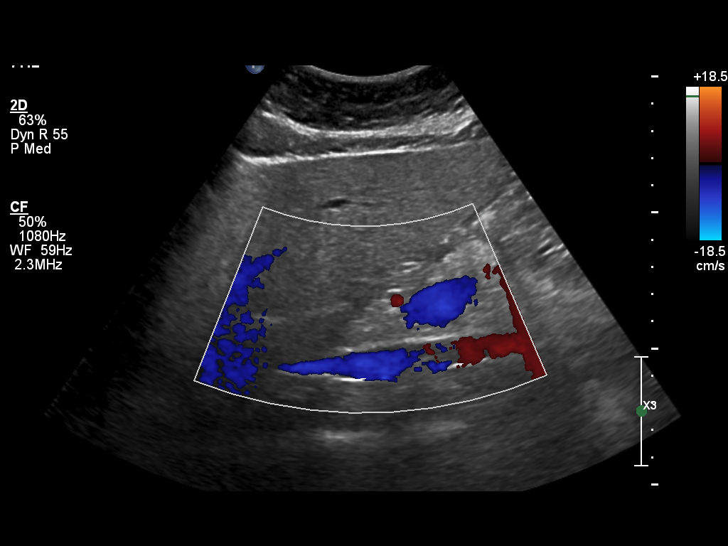
[im 19/55]
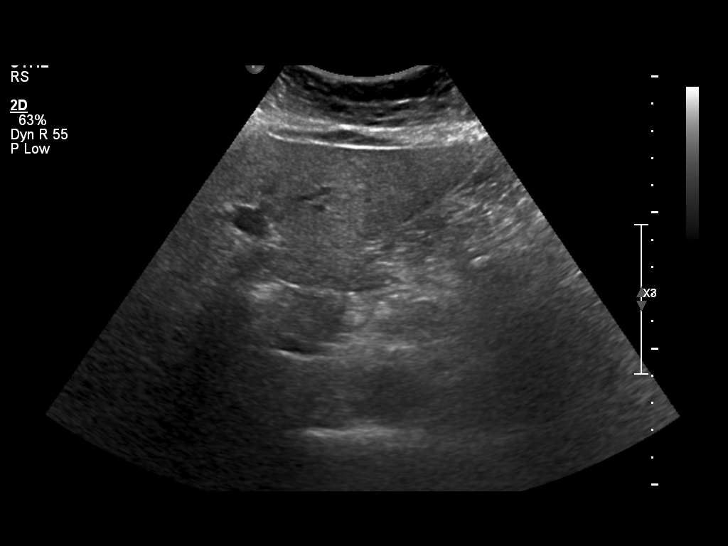
[im 21/55]
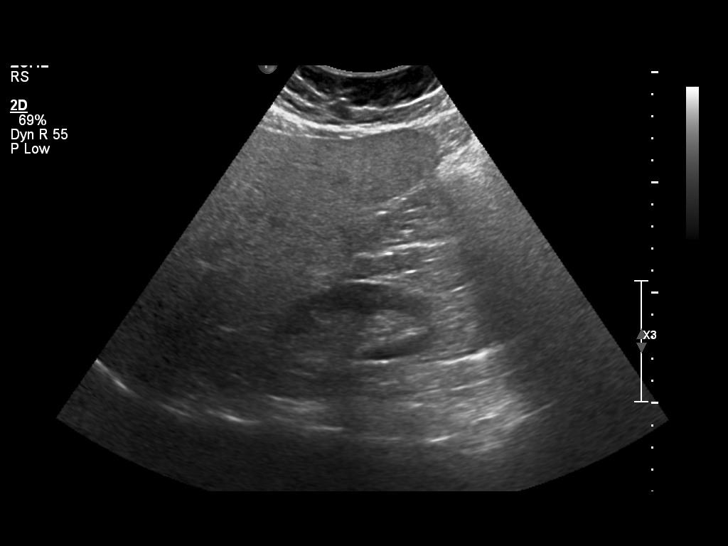
[im 25/55]
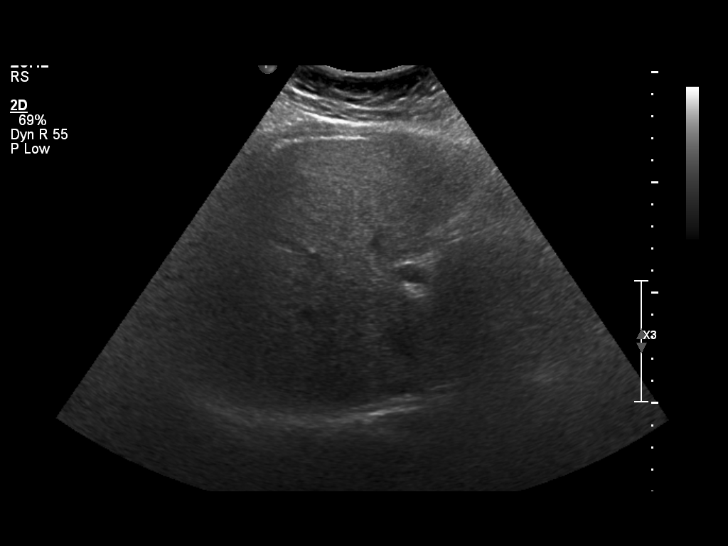
[im 30/55]
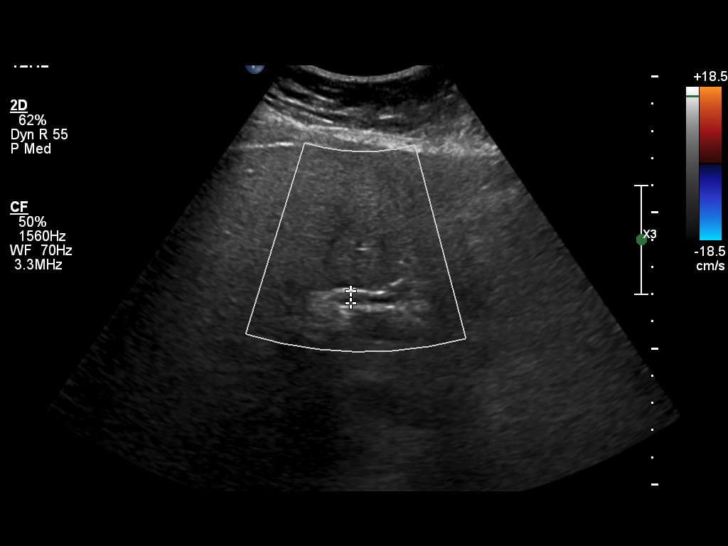
[im 34/55]
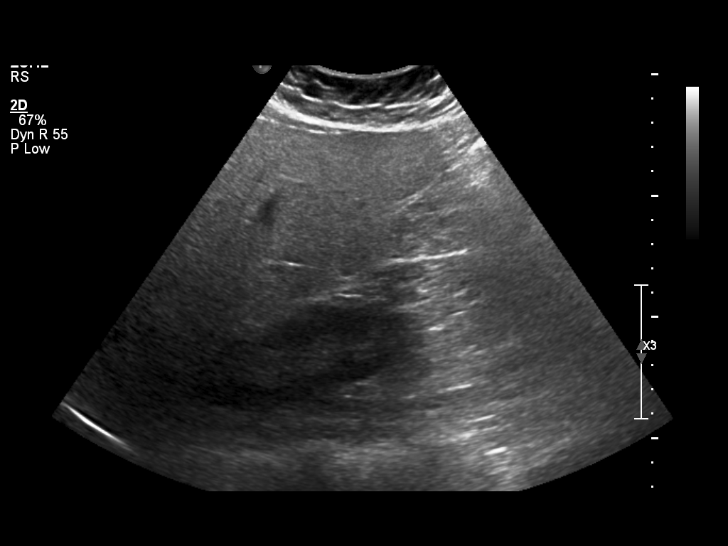
[im 37/55]
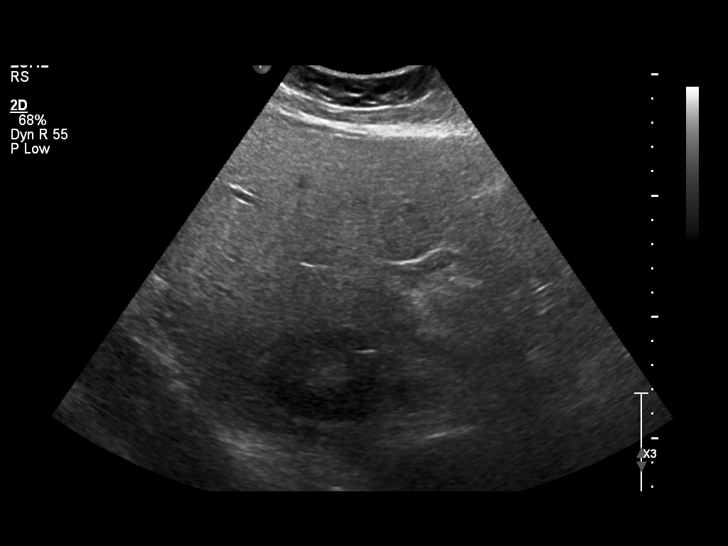
[im 41/55]
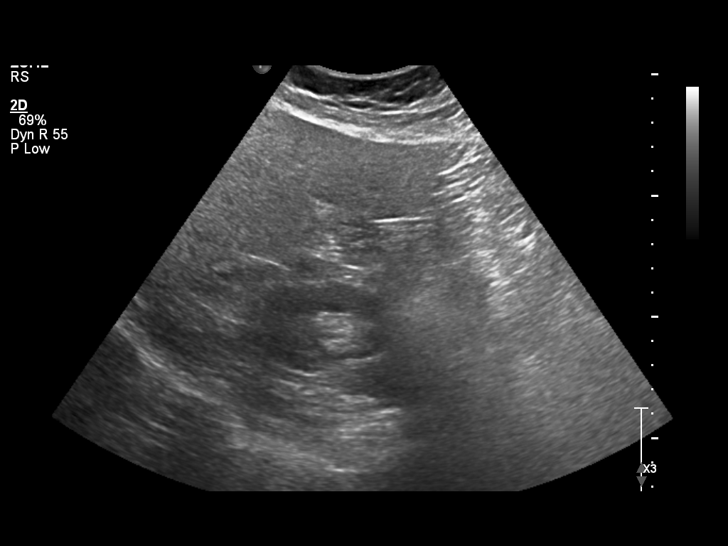
[im 46/55]
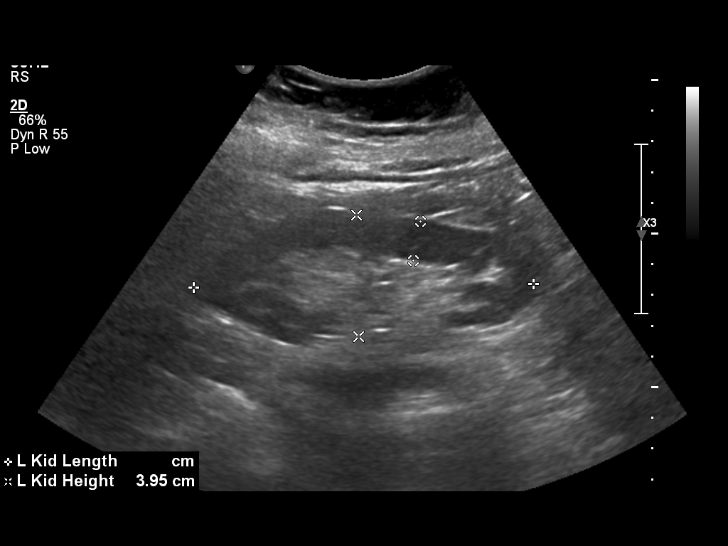
[im 50/55]
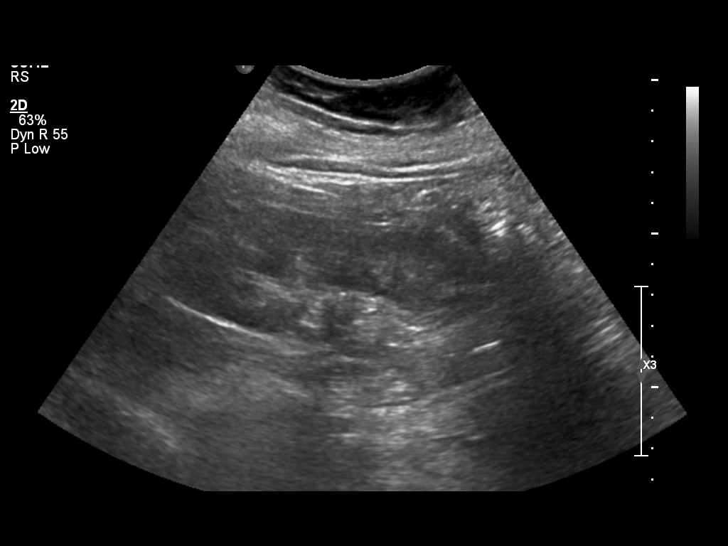
[im 55/55]
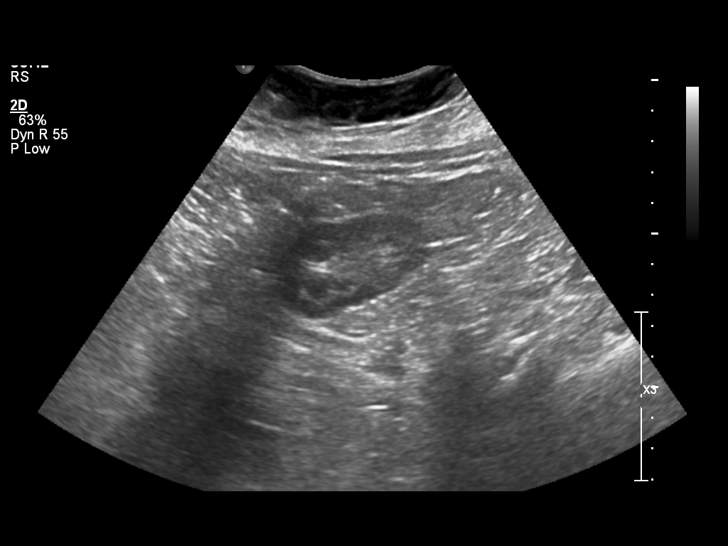

[14 of 25 positions shown; findings below may reference images not displayed]

DIAGNOSTIC STUDIES

EXAM

ULTRASOUND, ABDOMINAL, REAL TIME WITH IMAGE DOCUMENTATION, COMPLETE; CPT 81822

INDICATION

Abdominal pain.

TECHNIQUE

Sonographic evaluation of the abdomen was performed with multiple gray scale and color Doppler
images.

COMPARISONS

No priors available for comparison.

FINDINGS

PANCREAS: Limited evaluation of the pancreas secondary to overlying bowel gas.

LIVER: The liver has a homogeneous echotexture without evidence of intrahepatic biliary ductal
dilation. No discrete hepatic masses are seen.

GALLBLADDER: Cholecystectomy.

COMMON BILE DUCT: The common bile duct is not dilated.

IVC: The IVC is patent.

KIDNEYS: The kidneys are normal in size and position without evidence of hydronephrosis or renal
calculi.

SPLEEN: The spleen is normal in size and echotexture.

AORTA: No abdominal aortic aneurysm.

No significant free fluid within the abdomen.

IMPRESSION

Cholecystectomy.

Tech Notes:

stomach bloating

## 2018-12-18 MED ORDER — PEG-ELECTROLYTE SOLN 420 GRAM PO SOLR
4 L | Freq: Once | ORAL | 0 refills | Status: AC
Start: 2018-12-18 — End: ?

## 2018-12-26 ENCOUNTER — Encounter: Admit: 2018-12-26 | Discharge: 2018-12-27 | Payer: BC Managed Care – HMO

## 2018-12-26 IMAGING — CT CHEST WO(Adult)
4 of 5 series · 15 of 36 positions shown, 16 images · non-contrast
Comparison: none

[Series 2: thorax ax 5.00 br40 s3 · axial · 0.58mm/px · z∈[+1691,+1766]mm · 6 of 27 slices shown]
[im 4/27  lung]
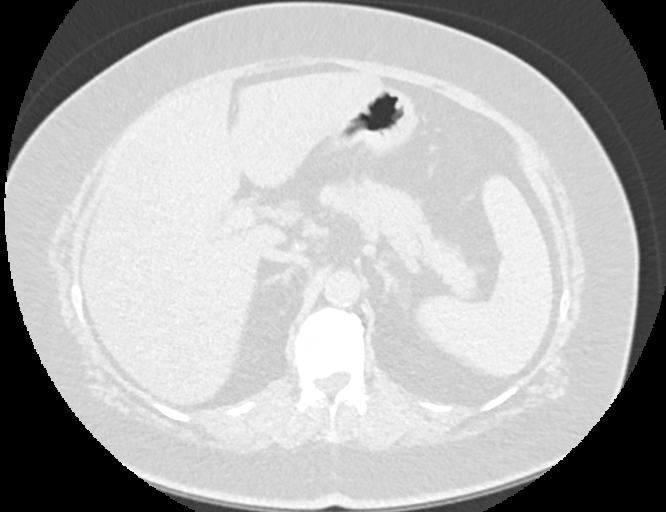
[im 7/27  lung]
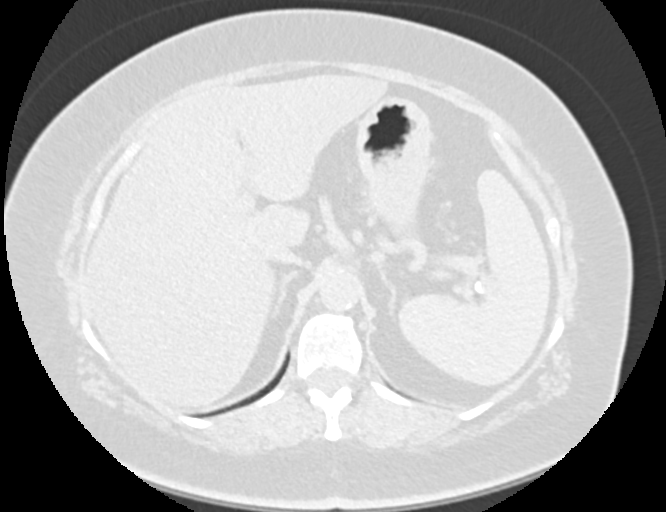
[im 10/27  lung]
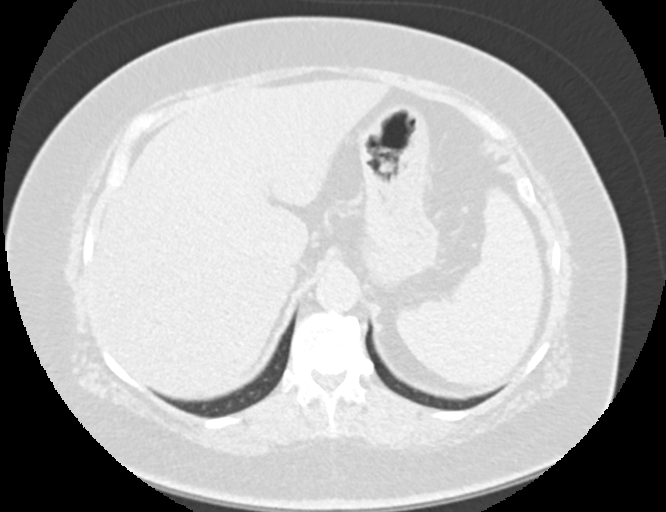
[im 13/27  lung]
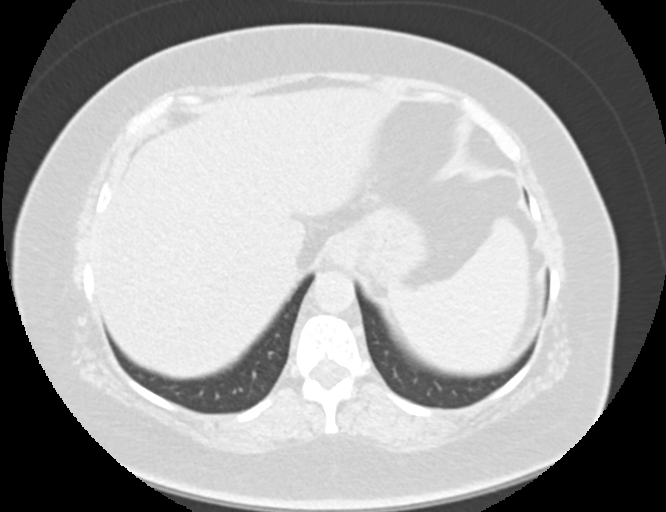
[im 16/27  lung]
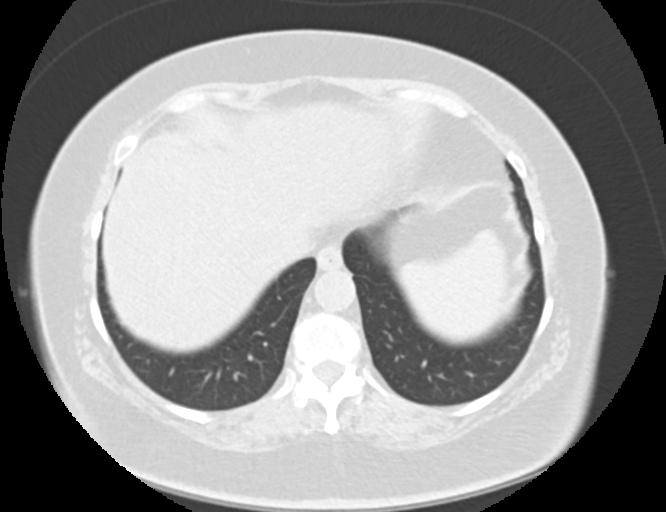
[im 19/27  lung]
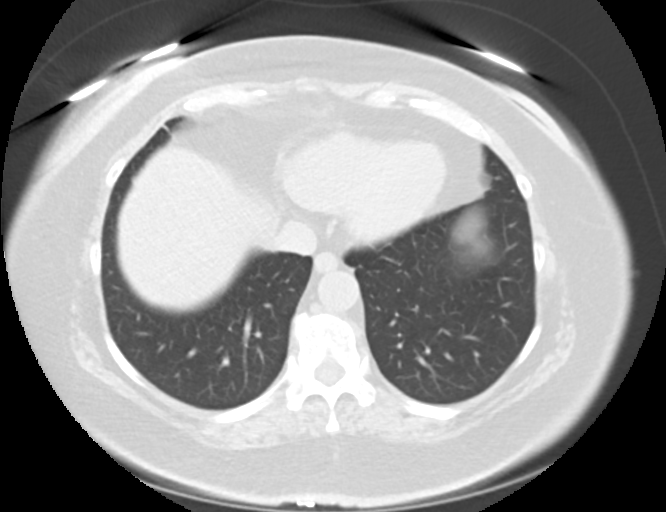

[Series 4: thorax cor 5.00 br40 s3 · coronal · 0.60mm/px · 3 of 31 slices shown]
[im 7/31  lung]
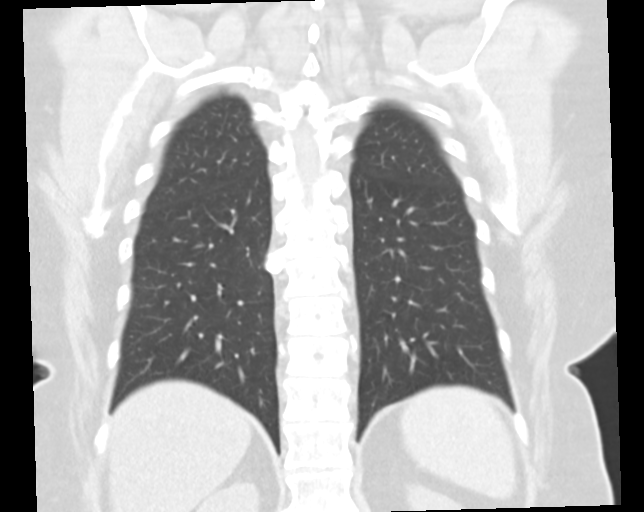
[im 13/31  lung]
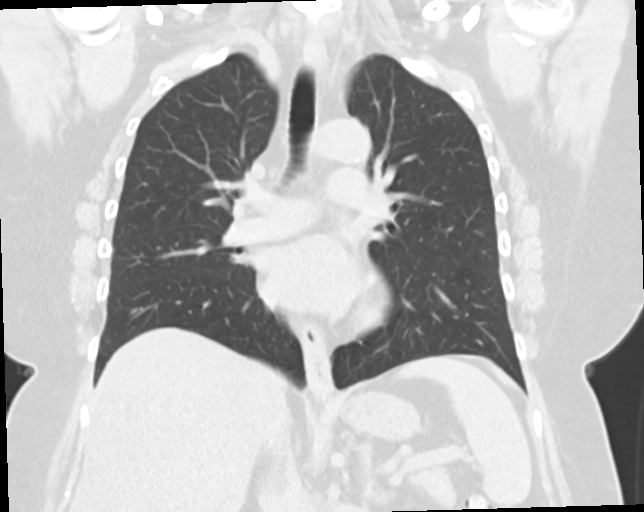
[im 19/31  lung]
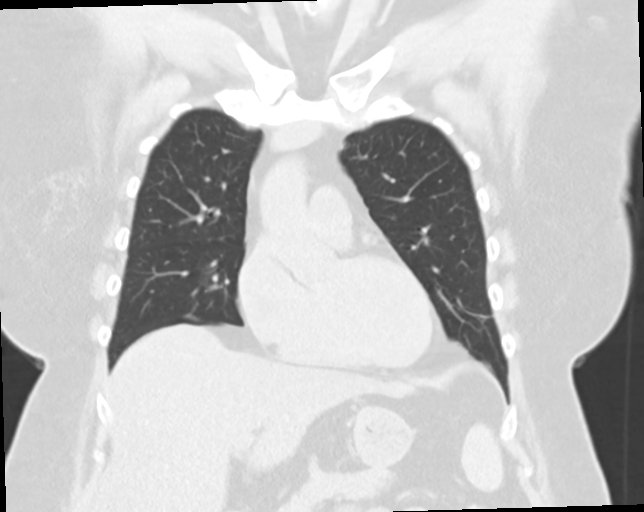

[Series 8: thorax ax 5.00 br60 s3 · axial · 0.58mm/px · z∈[+1711,+1926]mm · 3 of 8 slices shown, 4 images]
[im 2/8  mediastinal]
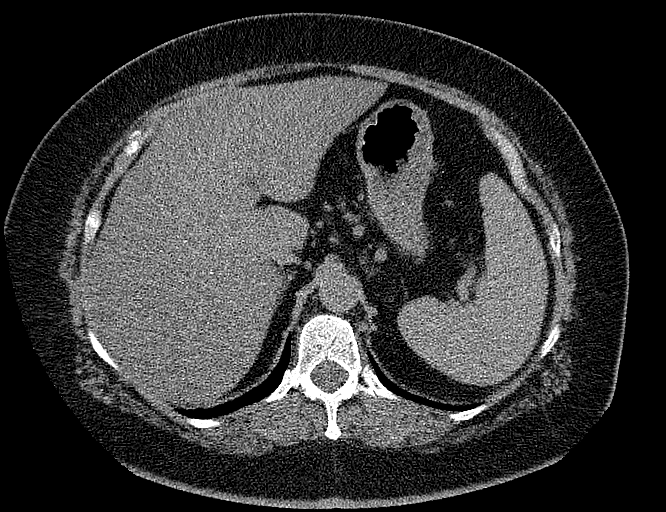
[im 2/8  lung]
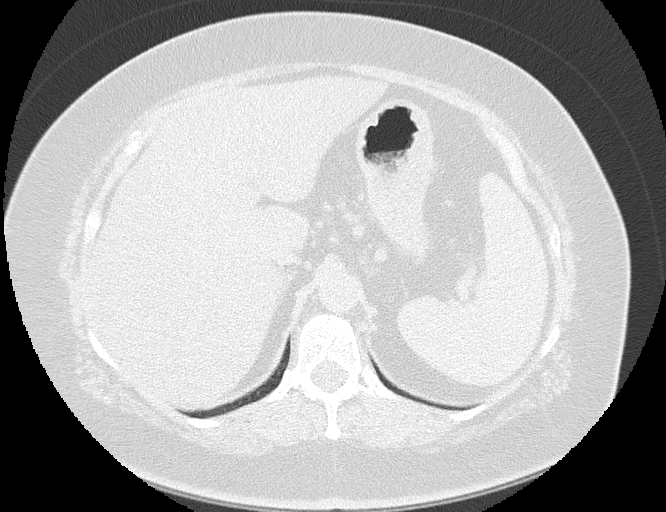
[im 4/8  lung]
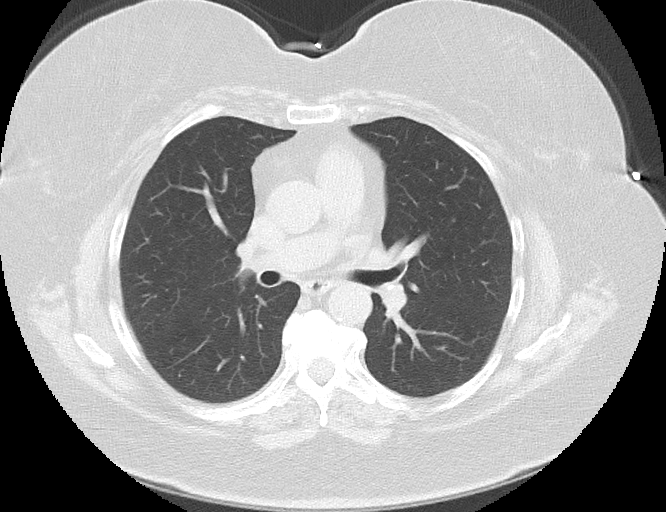
[im 6/8  lung]
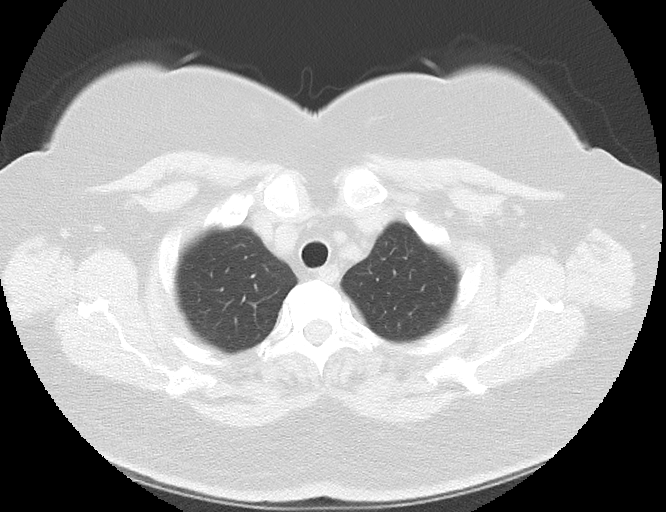

[Series 11: thorax 1.00 br60 s3 · axial · 0.76mm/px · z∈[+1747,+1895]mm · 3 of 8 slices shown]
[im 2/8  lung]
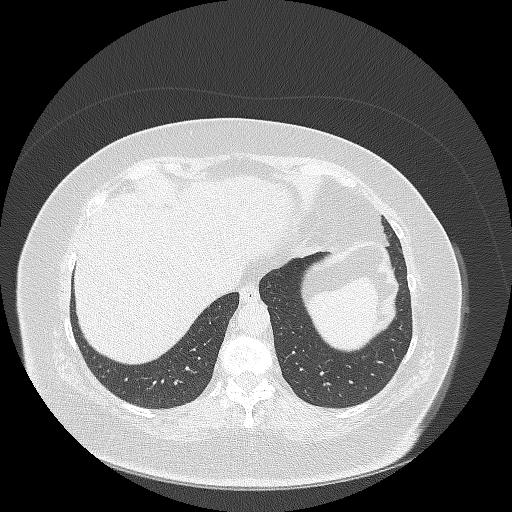
[im 4/8  lung]
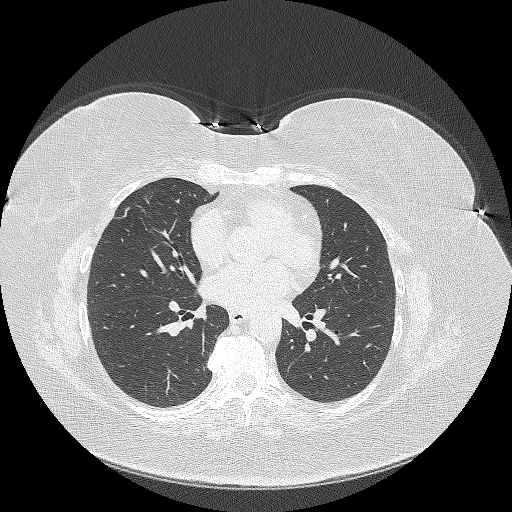
[im 6/8  lung]
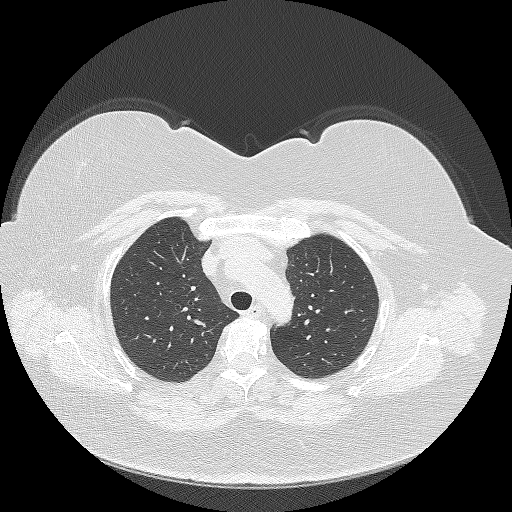

[15 of 36 positions shown; findings below may reference images not displayed]

DIAGNOSTIC STUDIES

EXAM

CT chest without contrast

INDICATION

persistent SOA
pt c/o soa, chest heaviness x1 month - denies sx hx to chest. non-smoker - [place]  CT/NM: 0/0

TECHNIQUE

Volumetric multi detector CT images of the chest are obtained without administration of low osmolar
intravenous contrast

All CT scans at this facility use dose modulation, iterative reconstruction, and/or weight based
dosing when appropriate to reduce radiation dose to as low as reasonably achievable.

COMPARISONS

None available.

FINDINGS

The thoracic inlet is grossly unremarkable. The thoracic aorta is non aneurysmal. There is no
mediastinal, hilar or axillary adenopathy. The trachea and bronchi are clear. There is no dense
consolidation, effusion, or pneumothorax. There is minimal likely atelectasis versus scar within
lingula. There is no evidence pulmonary mass lesion. The thoracic vertebral body heights are grossly
maintained with moderate multilevel degenerative changes. There is no significant spondylolisthesis.
The partially visualized upper abdomen is grossly unremarkable.

IMPRESSION

1.  No acute cardiopulmonary abnormality. Mild atelectasis within the lingula.

Tech Notes:

pt c/o soa, chest heaviness x1 month - denies sx hx to chest. non-smoker - [place]
CT/NM: 0/0

## 2018-12-31 ENCOUNTER — Ambulatory Visit: Admit: 2019-01-01 | Discharge: 2019-01-01 | Payer: BC Managed Care – HMO

## 2019-01-01 ENCOUNTER — Ambulatory Visit: Admit: 2018-12-31 | Discharge: 2018-12-31 | Payer: BC Managed Care – HMO

## 2019-01-01 ENCOUNTER — Ambulatory Visit: Admit: 2019-01-01 | Discharge: 2019-01-01 | Payer: BC Managed Care – HMO

## 2019-01-01 ENCOUNTER — Encounter: Admit: 2019-01-01 | Discharge: 2019-01-01 | Payer: BC Managed Care – HMO

## 2019-01-01 DIAGNOSIS — E119 Type 2 diabetes mellitus without complications: Principal | ICD-10-CM

## 2019-01-01 DIAGNOSIS — R140 Abdominal distension (gaseous): ICD-10-CM

## 2019-01-01 DIAGNOSIS — K5730 Diverticulosis of large intestine without perforation or abscess without bleeding: ICD-10-CM

## 2019-01-01 DIAGNOSIS — Z1211 Encounter for screening for malignant neoplasm of colon: Principal | ICD-10-CM

## 2019-01-01 DIAGNOSIS — D123 Benign neoplasm of transverse colon: ICD-10-CM

## 2019-01-01 DIAGNOSIS — R197 Diarrhea, unspecified: ICD-10-CM

## 2019-01-01 DIAGNOSIS — O009 Unspecified ectopic pregnancy without intrauterine pregnancy: ICD-10-CM

## 2019-01-01 LAB — POC GLUCOSE
Lab: 173 mg/dL — ABNORMAL HIGH (ref 70–100)
Lab: 150 mg/dL — ABNORMAL HIGH (ref 70–100)

## 2019-01-01 MED ORDER — LACTATED RINGERS IV SOLP
INTRAVENOUS | 0 refills | Status: DC
Start: 2019-01-01 — End: 2019-01-01
  Administered 2019-01-01: 18:00:00 1000.000 mL via INTRAVENOUS

## 2019-01-01 MED ORDER — PROPOFOL INJ 10 MG/ML IV VIAL
0 refills | Status: DC
Start: 2019-01-01 — End: 2019-01-01
  Administered 2019-01-01: 19:00:00 70 mg via INTRAVENOUS

## 2019-01-01 MED ORDER — LIDOCAINE (PF) 200 MG/10 ML (2 %) IJ SYRG
0 refills | Status: DC
Start: 2019-01-01 — End: 2019-01-01
  Administered 2019-01-01: 19:00:00 100 mg via INTRAVENOUS

## 2019-01-01 MED ORDER — PROPOFOL 10 MG/ML IV EMUL 20 ML (INFUSION)(AM)(OR)
INTRAVENOUS | 0 refills | Status: DC
Start: 2019-01-01 — End: 2019-01-01
  Administered 2019-01-01: 19:00:00 150 ug/kg/min via INTRAVENOUS

## 2019-01-01 MED ORDER — METOCLOPRAMIDE HCL 5 MG/ML IJ SOLN
10 mg | Freq: Once | INTRAVENOUS | 0 refills | Status: CN | PRN
Start: 2019-01-01 — End: ?

## 2019-01-01 NOTE — Anesthesia Pre-Procedure Evaluation
Anesthesia Pre-Procedure Evaluation    Name: [facility]      MRN: [id number]     DOB: Nov 27, 1955     Age: 63 y.o.     Sex: female   _________________________________________________________________________     Procedure Info:   Procedure Information     Date/Time:  01/01/19 1245    Procedure:  COLONOSCOPY DIAGNOSTIC WITH SPECIMEN COLLECTION BY BRUSHING/ WASHING - FLEXIBLE (N/A )    Location:  END[facility] / ENDO/GI    Surgeon:  Tommie Sams, MD          Physical Assessment  Vital Signs (last filed in past 24 hours):  Height: 160 cm (63) (03/05 1147)  Weight: 86.2 kg (190 lb) (03/05 1147)      Patient History   No Known Allergies     Current Medications    Medication Directions   atorvastatin (LIPITOR) 10 mg tablet Take 10 mg by mouth daily.   blood sugar diagnostic test strip Use one strip as directed before meals and at bedtime.   sertraline (ZOLOFT) 25 mg tablet Take 25 mg by mouth daily.   SITagliptin-metformin (JANUMET XR) 50-1,000 mg XR tablet Take two tablets by mouth daily.         Review of Systems/Medical History      Patient summary reviewed  Nursing notes reviewed  Pertinent labs reviewed    PONV Screening: Female gender and Non-smoker  No history of anesthetic complications  No family history of anesthetic complications        Pulmonary           Shortness of breath (Pt states she has been to the doctor recetly due to shortness of breath at time. Possible bronchitis.  Pt states she does not feel short of breath today. Pt states it may be more due to axiety. )      Cardiovascular         Exercise tolerance: >4 METS      Hyperlipidemia      GI/Hepatic/Renal - negative        Neuro/Psych - negative          Psychiatric history          Anxiety        Endocrine/Other       Diabetes, well controlled, type 2      Obesity   Physical Exam    Airway Findings      Mallampati: I      TM distance: <3 FB      Neck ROM: full      Mouth opening: good      Airway patency: adequate    Dental Findings:

## 2019-01-02 ENCOUNTER — Encounter: Admit: 2019-01-02 | Discharge: 2019-01-02 | Payer: BC Managed Care – HMO

## 2019-01-02 DIAGNOSIS — E119 Type 2 diabetes mellitus without complications: Principal | ICD-10-CM

## 2019-01-02 DIAGNOSIS — O009 Unspecified ectopic pregnancy without intrauterine pregnancy: ICD-10-CM

## 2019-01-06 ENCOUNTER — Encounter: Admit: 2019-01-06 | Discharge: 2019-01-06 | Payer: BC Managed Care – HMO

## 2019-01-06 ENCOUNTER — Ambulatory Visit: Admit: 2019-01-06 | Discharge: 2019-01-07 | Payer: BC Managed Care – HMO

## 2019-01-06 DIAGNOSIS — R079 Chest pain, unspecified: ICD-10-CM

## 2019-01-06 DIAGNOSIS — R0602 Shortness of breath: Principal | ICD-10-CM

## 2019-01-06 DIAGNOSIS — R5383 Other fatigue: ICD-10-CM

## 2019-01-06 DIAGNOSIS — E1121 Type 2 diabetes mellitus with diabetic nephropathy: ICD-10-CM

## 2019-01-09 ENCOUNTER — Encounter: Admit: 2019-01-09 | Discharge: 2019-01-09 | Payer: BC Managed Care – HMO

## 2019-01-21 ENCOUNTER — Encounter: Admit: 2019-01-21 | Discharge: 2019-01-21 | Payer: BC Managed Care – HMO

## 2019-01-21 DIAGNOSIS — R0602 Shortness of breath: Principal | ICD-10-CM

## 2019-02-01 ENCOUNTER — Encounter: Admit: 2019-02-01 | Discharge: 2019-02-01 | Payer: BC Managed Care – HMO

## 2019-02-09 ENCOUNTER — Encounter: Admit: 2019-02-09 | Discharge: 2019-02-09 | Payer: BC Managed Care – HMO

## 2019-02-12 ENCOUNTER — Encounter: Admit: 2019-02-12 | Discharge: 2019-02-12 | Payer: BC Managed Care – HMO

## 2019-02-18 ENCOUNTER — Encounter: Admit: 2019-02-18 | Discharge: 2019-02-18 | Payer: BC Managed Care – HMO

## 2019-03-12 ENCOUNTER — Encounter: Admit: 2019-03-12 | Discharge: 2019-03-12 | Payer: BC Managed Care – HMO

## 2019-09-30 ENCOUNTER — Encounter: Admit: 2019-09-30 | Discharge: 2019-09-30 | Payer: BC Managed Care – HMO

## 2019-09-30 ENCOUNTER — Ambulatory Visit: Admit: 2019-09-30 | Discharge: 2019-10-01 | Payer: BC Managed Care – HMO

## 2019-09-30 DIAGNOSIS — E119 Type 2 diabetes mellitus without complications: Secondary | ICD-10-CM

## 2019-09-30 DIAGNOSIS — O009 Unspecified ectopic pregnancy without intrauterine pregnancy: Secondary | ICD-10-CM

## 2019-09-30 NOTE — Telephone Encounter
Fax received providing lab results for patient.  Routed to provider for review.

## 2019-09-30 NOTE — Progress Notes
Obtained patient's verbal consent to treat them and their agreement to [facility] financial policy and NPP via this telehealth visit during the Coronavirus Public Health Emergency    Subjective:       [facility] is a 63 y.o. female who presents to the [facility] for ongoing management of type 2 diabetes and was referred by her PCP, Melissa Huntington PA-C. She was last seen by me in 10/2018.     Interval History   - Doing and feeling well.   Micah Flesher to her PCP today, did labs, flu shot.   - Her daughter, [name], is expecting a baby in June. This is a motivating factor to continue to stay healthy.   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   - Trying to drink more water and fluids in general, but is still frequently drinking pop. She has noted more fluid retention when drinking pop. She feels better when sticking to water and would like to focus on this.    - Likes drinking bottled water the best. Likes it very cold-- makes it easier to stay consistent  - She is still getting the feeling of intense itching deep in her feet intermittently. Doesn't seem to be more common at a certain time of day. Happens in her toes, soles of feet, as well as heels intermittently. Not related to position of feet or legs. No skin abnormalities.     - She attending our cooking class in June 2019 with her daughter, [name], and they both really enjoyed this. She is on [employer] email list for our virtual classes.     A1C was 7.0% in 10/2018, was previously 7.4 in 05/2018. Pending results of hemoglobin A1C from her PCP visit today.   Wt today is 182 lbs.    Type 2 DM:   Dx: 10/2017  Current treatment: Janumet XR [phone] mg daily   Adherence: all of the time  Past treatment: Metformin ER, Metformin   SMBG and consistency: in the event of abnormal symptoms.    Meals per day: 2-3  Exercise: None. Did want to start riding her bike.    Family history of diabetes: none Social: She works from home, recently opened a store. She spends lots of time with her 3 children-- her son [name] lives close by.      Medical History:   Diagnosis Date   ? Ectopic pregnancy    ? Type II diabetes mellitus [facility])      Surgical History:   Procedure Laterality Date   ? COLONOSCOPY DIAGNOSTIC WITH SPECIMEN COLLECTION BY BRUSHING/ WASHING - FLEXIBLE N/A 01/01/2019    Performed by Tommie Sams, MD at [facility] ENDO   ? COLONOSCOPY WITH SNARE REMOVAL TUMOR/ POLYP/ OTHER LESION  01/01/2019    Performed by Tommie Sams, MD at [facility] ENDO   ? COLONOSCOPY WITH BIOPSY - FLEXIBLE  01/01/2019    Performed by Tommie Sams, MD at [facility] ENDO   ? CESAREAN SECTION      x2   ? HX APPENDECTOMY     ? HX CHOLECYSTECTOMY       Family History   Problem Relation Age of Onset   ? Cancer Mother    ? Cancer-Breast Mother      Social History     Socioeconomic History   ? Marital status: Married     Spouse name: Not on file   ? Number of children: Not on file   ?  Years of education: Not on file   ? Highest education level: Not on file   Occupational History   ? Not on file   Tobacco Use   ? Smoking status: Never Smoker   ? Smokeless tobacco: Never Used   Substance and Sexual Activity   ? Alcohol use: Not Currently   ? Drug use: Not Currently   ? Sexual activity: Not on file   Other Topics Concern   ? Not on file   Social History Narrative   ? Not on file       Review of Systems   Skin:        Itching on feet          Objective:  ? atorvastatin (LIPITOR) 10 mg tablet Take 10 mg by mouth daily.   ? blood sugar diagnostic test strip Use one strip as directed before meals and at bedtime.   ? sertraline (ZOLOFT) 25 mg tablet Take 25 mg by mouth daily.   ? SITagliptin-metformin (JANUMET XR) 50-1,000 mg XR tablet Take two tablets by mouth daily.     Vitals:    09/30/19 1350   Weight: 82.6 kg (182 lb)   Height: 160 cm (63)   PainSc: Zero     Body mass index is 32.24 kg/m?Marland Kitchen     Physical Exam Visit conducted via phone. Patient sounds comfortable. Very pleasant and interactive    Labs:  Requesting from PCP     Assessment and Plan:  Diabetes mellitus type 2, controlled   A1c 7.0% in 10/2018, pending from today.   Target A1c <7% without hypoglycemia   Currently on Janumet XR [phone] mg daily   No known complications    Type 2 diabetes mellitus without complication  - Continue Janumet XR [phone] mg daily   - Briefly discussed other treatment options if hemoglobin A1C today is > 7-- she would prefer to focus on decreasing soda first before considering other medications.   - Avoiding SGLT2 due to frequent UTIs.   - Discussed signs and symptoms of hypoglycemia and hyperglycemia. Discussed low risk of hypoglycemia with current treatment.     Diabetic complications - prevention and management:   Annual Dilated eye exam: 04/2018. No retinopathy. Plans to set another visit.   Annual labs (electrolytes and renal function): today with PCP  Lipid panel: today with PCP  On Statin?: atorvastatin 10 mg   Annual Urine microalbumin/Cr: today with PCP     Blood pressure: 131/75. Normotensive today.     On an ACE/ARB?: No  Foot exam / monofilament exam (recommended annually): Last done 03/03/18 without deficits. Still getting the intermittent foot itching.  Diabetic Educator and/or nutritionist visit (recommended annually): declines for now. Has participated in healthy cooking class through the [facility]-- on the email list.     BMI is 32. Discussed diet and exercise. Her main goal is increasing water and decreasing pop.     Mixed hyperlipidemia  - Continue atorvastatin 10 mg at bedtime     RTC in 3 months in person     No future appointments.  Total time 30 minutes.  Estimated counseling time 25 minutes.  Counseled extensively regarding diabetes management.         Martie Round, PA-C

## 2019-09-30 NOTE — Telephone Encounter
Spoke with daughter to obtain new # for patient.  LVM for patient on new # to advise appointment is TH.  Provided nurse call back # for any questions.

## 2019-10-02 ENCOUNTER — Encounter: Admit: 2019-10-02 | Discharge: 2019-10-02 | Payer: BC Managed Care – HMO

## 2019-10-02 NOTE — Telephone Encounter
Lab results received from provider.  Routing to Meghan Kingsley, PA-C for review.

## 2019-11-11 ENCOUNTER — Encounter: Admit: 2019-11-11 | Discharge: 2019-11-11 | Payer: BC Managed Care – HMO

## 2019-11-11 DIAGNOSIS — E119 Type 2 diabetes mellitus without complications: Secondary | ICD-10-CM

## 2019-11-11 MED ORDER — JANUMET XR 50-1,000 MG PO TM24
ORAL_TABLET | Freq: Every day | ORAL | 2 refills | Status: AC
Start: 2019-11-11 — End: ?

## 2020-01-11 ENCOUNTER — Encounter: Admit: 2020-01-11 | Discharge: 2020-01-11 | Payer: BC Managed Care – HMO

## 2020-01-11 NOTE — Telephone Encounter
Fax received from pharmacy requesting change in prescription for Janumet.  Fax stated insurance will only cover 1 tablet per day.  Routing to provider to review/advise.

## 2020-01-18 ENCOUNTER — Encounter: Admit: 2020-01-18 | Discharge: 2020-01-18 | Payer: BC Managed Care – HMO

## 2020-02-08 ENCOUNTER — Encounter: Admit: 2020-02-08 | Discharge: 2020-02-08 | Payer: No Typology Code available for payment source

## 2020-02-08 ENCOUNTER — Ambulatory Visit: Admit: 2020-02-08 | Discharge: 2020-02-09 | Payer: No Typology Code available for payment source

## 2020-02-08 DIAGNOSIS — O009 Unspecified ectopic pregnancy without intrauterine pregnancy: Secondary | ICD-10-CM

## 2020-02-08 DIAGNOSIS — E119 Type 2 diabetes mellitus without complications: Secondary | ICD-10-CM

## 2020-02-08 NOTE — Progress Notes
Patient needs Diabetic eye exam, however for insurance needs a statement from physician.

## 2020-02-08 NOTE — Progress Notes
Subjective:       [facility] is a 64 y.o. female who presents to the [facility] for ongoing management of type 2 diabetes and was referred by her PCP, Taylor Huntington PA-C. She was last seen by me in 09/2019.     Interval History   Since her last visit, she was diagnosed with COVID in 10/2019 and was subsequently treated with two different steroid bursts.   She has been more stressed recently and having trouble sleeping.   Her daughter, [facility], is expecting a baby in June.   She increased her soda intake compared to her last visit-- she would like to work on decreasing this and increasing her water intake. She doesn't eat or drink regularly-- often has to force herself to eat and drink.   She was off of her medication due to a change in her insurance-- she is now back on.     She attending our cooking class in June 2019 with her daughter, Taylor [facility], and they both really enjoyed this. She is on [facility] email list for our virtual classes.     A1C today is 8.3%, was 6.6% in 09/2019.  Wt today is 183 lbs. Stable.     Type 2 DM:   Dx: 10/2017  Current treatment: Janumet XR [phone] mg daily (2 tabs)  Adherence: all of the time  Past treatment: Metformin ER, Metformin   SMBG and consistency: in the event of abnormal symptoms.    Meals per day: 2-3  Exercise: None currently.     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 She spends lots of time with her 3 children-- her son Taylor [facility] lives close by.      Medical History:   Diagnosis Date   ? Ectopic pregnancy    ? Type II diabetes mellitus [facility])      Surgical History:   Procedure Laterality Date   ? COLONOSCOPY DIAGNOSTIC WITH SPECIMEN COLLECTION BY BRUSHING/ WASHING - FLEXIBLE N/A 01/01/2019    Performed by [facility] Sams, MD at [facility]   ? COLONOSCOPY WITH SNARE REMOVAL TUMOR/ POLYP/ OTHER LESION  01/01/2019    Performed by [facility] Sams, MD at [facility]   ? COLONOSCOPY WITH BIOPSY - FLEXIBLE  01/01/2019    Performed by [facility] Sams, MD at [facility]   ? CESAREAN SECTION      x2   ? HX APPENDECTOMY     ? HX CHOLECYSTECTOMY       Family History   Problem Relation Age of Onset   ? Cancer Mother    ? Cancer-Breast Mother      Social History     Socioeconomic History   ? Marital status: Married     Spouse name: Not on file   ? Number of children: Not on file   ? Years of education: Not on file   ? Highest education level: Not on file   Occupational History   ? Not on file   Tobacco Use   ? Smoking status: Never Smoker   ? Smokeless tobacco: Never Used   Substance and Sexual Activity   ? Alcohol use: Not Currently   ? Drug use: Not Currently   ? Sexual activity: Not on file   Other Topics Concern   ? Not on file   Social History Narrative   ? Not on file       Review of Systems   Positive for  intermittent itching        Objective:  ? atorvastatin (LIPITOR) 10 mg tablet Take 10 mg by mouth daily.   ? blood sugar diagnostic test strip Use one strip as directed before meals and at bedtime.   ? JANUMET XR 50-1,000 mg XR tablet Take 2 tablets by mouth once daily   ? sertraline (ZOLOFT) 25 mg tablet Take 25 mg by mouth daily.     Vitals:    02/08/20 1503   BP: 127/74   BP Source: Arm, Left Upper   Patient Position: Sitting   Pulse: 81   Temp: 36.2 ?C (97.1 ?F)   TempSrc: Oral   Weight: 83 kg (183 lb)   Height: 160 cm (63)   PainSc: Zero     Body mass index is 32.42 kg/m?Marland Kitchen     Physical Exam  Vitals signs and nursing note reviewed.   Constitutional:       General: She is not in acute distress.     Appearance: She is well-developed. She is not diaphoretic.   HENT:      Head: Normocephalic and atraumatic.   Eyes:      Conjunctiva/sclera: Conjunctivae normal.   Neck:      Musculoskeletal: Normal range of motion and neck supple.   Cardiovascular:      Rate and Rhythm: Normal rate and regular rhythm.      Pulses:           Dorsalis pedis pulses are 2+ on the right side and 2+ on the left side.        Posterior tibial pulses are 2+ on the right side and 2+ on the left side.      Heart sounds: Normal heart sounds. No murmur. No friction rub. No gallop.    Pulmonary:      Effort: Pulmonary effort is normal.      Breath sounds: Normal breath sounds. No wheezing or rales.   Musculoskeletal: Normal range of motion.      Right foot: Normal range of motion. No deformity.      Left foot: Normal range of motion. No deformity.   Feet:      Right foot:      Protective Sensation: 5 sites tested. 5 sites sensed.      Skin integrity: No ulcer or blister.      Left foot:      Protective Sensation: 5 sites tested. 5 sites sensed.      Skin integrity: No ulcer or blister.   Skin:     General: Skin is warm and dry.      Capillary Refill: Capillary refill takes less than 2 seconds.   Neurological:      Mental Status: She is alert and oriented to person, place, and time.   Psychiatric:         Behavior: Behavior normal.         Thought Content: Thought content normal.         Judgment: Judgment normal.     Diabetic Foot Exam       Bilateral vascular, sensation, integument are normal:  Yes       Labs:  PCP from 09/2019     Assessment and Plan:  Diabetes mellitus type 2, uncontrolled   A1c 8.3%, not improved  Target A1c <7% without hypoglycemia   Currently on Janumet XR [phone] mg daily   No known complications    Type 2 diabetes mellitus without complication  - Continue Janumet  XR [phone] mg daily   - Plans to reduce pop and increase water intake. Goal of at least one 16 oz bottle per day.   - Discussed SGLT2 today-- she declines due to concern about potential for yeast infections-- she would like to work on decreasing pop first.     Diabetic complications - prevention and management:   Annual Dilated eye exam: 04/2018. No retinopathy. Plans to set another visit.   Annual labs (electrolytes and renal function): 09/2019. eGFR > 60.   Lipid panel: today with PCP  On Statin?: atorvastatin 10 mg   Annual Urine microalbumin/Cr: 09/2019     Blood pressure: Normotensive today.     On an ACE/ARB?: No  Foot exam / monofilament exam (recommended annually): Last done today with no deficits.   Diabetic Educator and/or nutritionist visit (recommended annually): declines for now. Has participated in healthy cooking class through the [facility]-- on the email list. Discussed the website for recipes.     BMI is 32. Discussed diet and exercise. Her main goal is increasing water and decreasing pop.     Mixed hyperlipidemia  - Continue atorvastatin 10 mg at bedtime   - Received lipid profile from PCP-- will be scanned into media. LDL is at goal.     RTC in 3 months     Future Appointments   Date Time Provider Department [facility]   06/13/2020  1:30 PM Martie Round, PA-C [facility]     Total time 30 minutes.  Estimated counseling time 25 minutes.  Counseled extensively regarding diabetes management.         Martie Round, PA-C

## 2020-02-09 DIAGNOSIS — E119 Type 2 diabetes mellitus without complications: Secondary | ICD-10-CM

## 2020-06-13 ENCOUNTER — Encounter: Admit: 2020-06-13 | Discharge: 2020-06-13 | Payer: No Typology Code available for payment source

## 2020-08-29 ENCOUNTER — Encounter: Admit: 2020-08-29 | Discharge: 2020-08-29 | Payer: No Typology Code available for payment source

## 2020-08-29 NOTE — Telephone Encounter
Pt called, LVM  States she has been having trouble getting her Janumet since June  Reports having some glucose problems recently, wondering about alternatives?  Reports she doesn't have appt with Criselda Peaches PA-C until December, and would like advisement prior to that  Routing to [employer] for advisement

## 2020-08-29 NOTE — Telephone Encounter
Any idea why she has been having trouble filling the Janumet? Change in insurance formulary or anything or does it need a prior auth? (would recommend having her call insurance to see why)     Jentadueto or kombiglyze would be the same class of medication if insurance would cover those instead.     Her and I had also discussed SGLT2 inhibitors at her last visit-- she had declined at the time due to the potential side effect of yeast infections.     I can do an overbook appointment next Tuesday at 11:30am to discuss if she can come then.

## 2020-10-03 ENCOUNTER — Ambulatory Visit: Admit: 2020-10-03 | Discharge: 2020-10-04 | Payer: No Typology Code available for payment source

## 2020-10-03 ENCOUNTER — Encounter: Admit: 2020-10-03 | Discharge: 2020-10-03 | Payer: No Typology Code available for payment source

## 2020-10-03 DIAGNOSIS — O009 Unspecified ectopic pregnancy without intrauterine pregnancy: Secondary | ICD-10-CM

## 2020-10-03 DIAGNOSIS — E119 Type 2 diabetes mellitus without complications: Secondary | ICD-10-CM

## 2020-10-03 MED ORDER — OZEMPIC 0.25 MG OR 0.5 MG(2 MG/1.5 ML) SC PNIJ
3 refills | Status: AC
Start: 2020-10-03 — End: ?

## 2020-10-04 ENCOUNTER — Encounter: Admit: 2020-10-04 | Discharge: 2020-10-04 | Payer: No Typology Code available for payment source

## 2020-10-04 DIAGNOSIS — O009 Unspecified ectopic pregnancy without intrauterine pregnancy: Secondary | ICD-10-CM

## 2020-10-04 DIAGNOSIS — E119 Type 2 diabetes mellitus without complications: Secondary | ICD-10-CM

## 2020-11-01 ENCOUNTER — Encounter: Admit: 2020-11-01 | Discharge: 2020-11-01

## 2021-02-17 IMAGING — MG MAMMOGRAM, DIGITAL SCREEN BILA
1 series · 4 of 4 positions shown · non-contrast
Comparison: none

[Series 2: R CC · right · 4 of 4 slices shown]
[im 1/4]
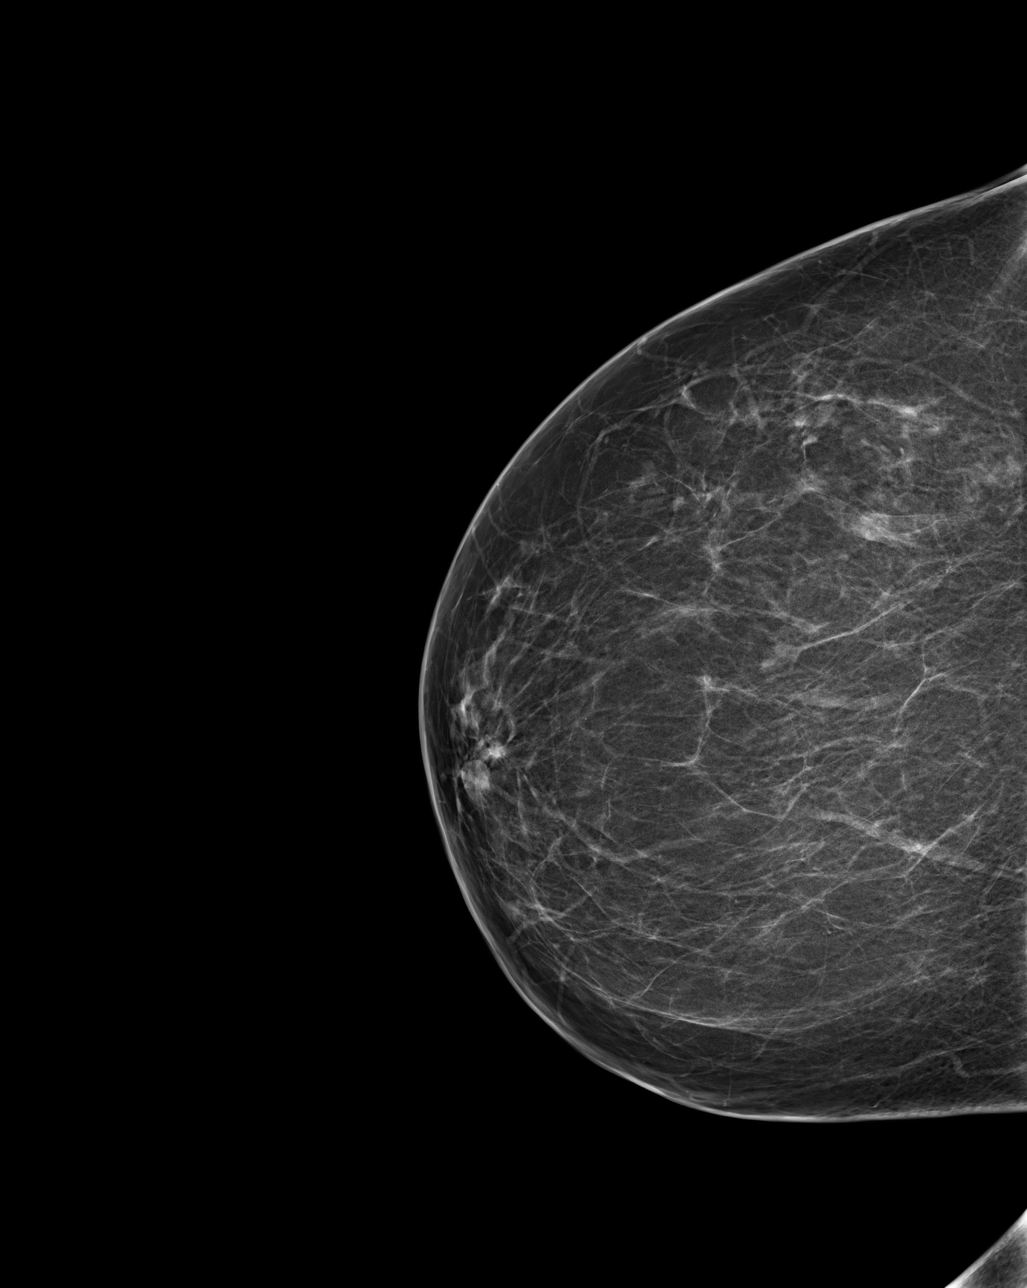
[im 2/4]
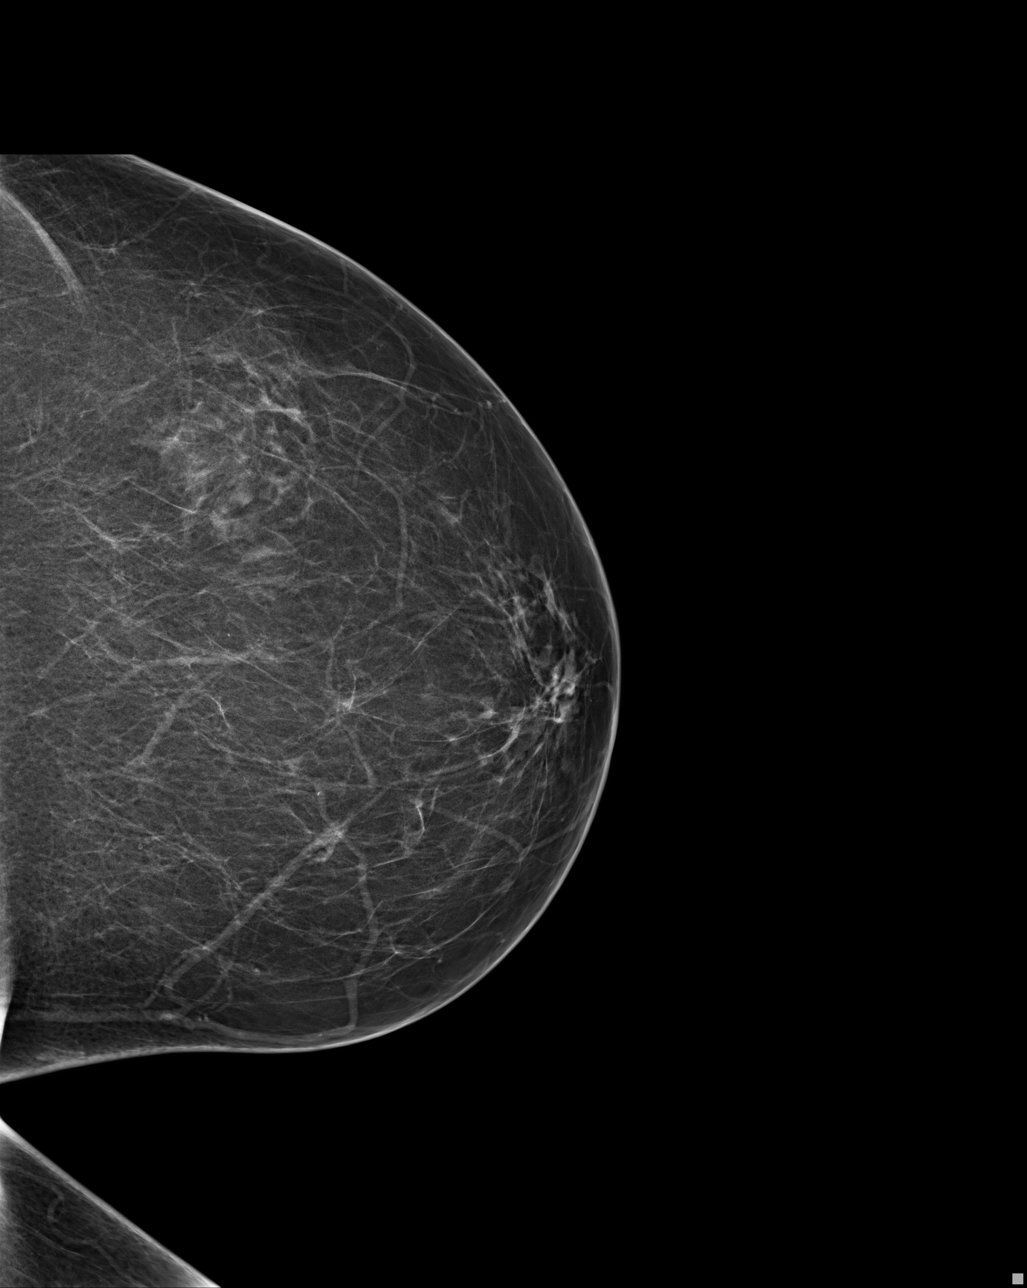
[im 3/4]
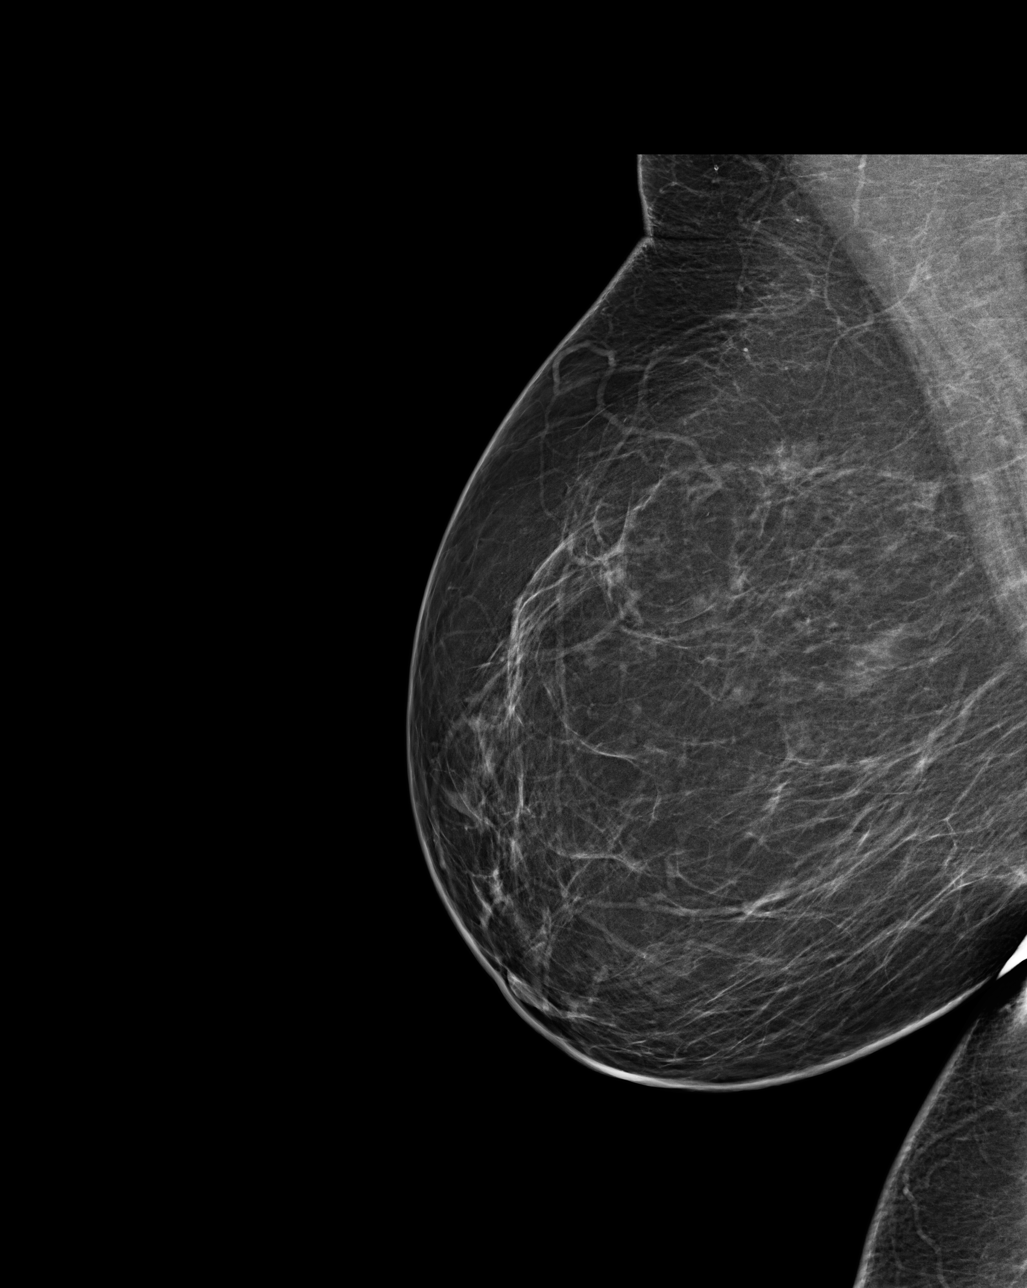
[im 4/4]
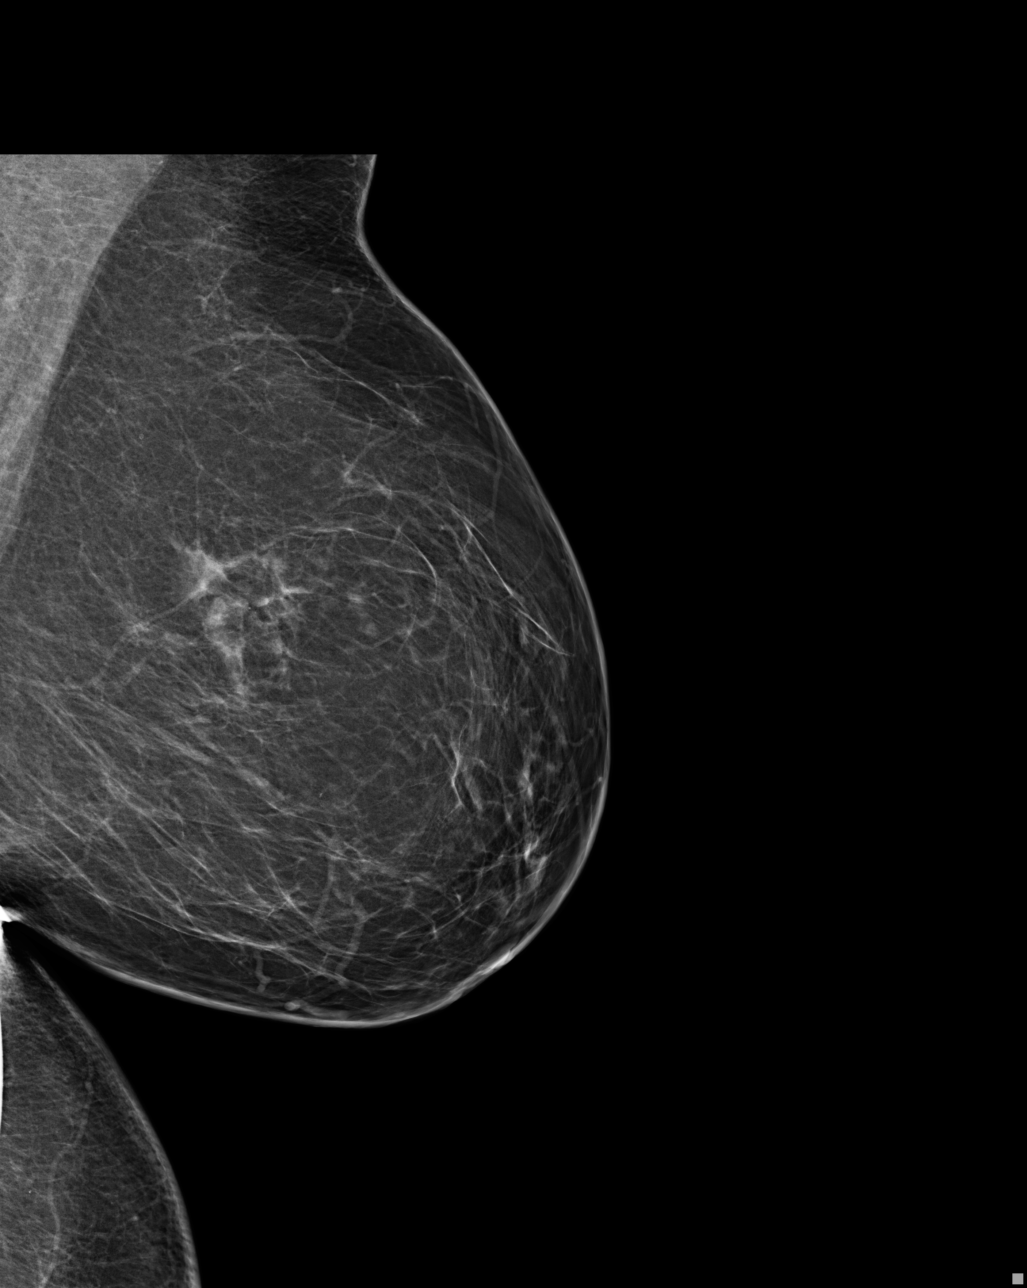

[4 of 4 positions shown; findings below may reference images not displayed]

EXAM

SCREENING MAMMOGRAM, BILATERAL

INDICATION

screening
ADOPTED, BUT FOUND OUT BIRTH MOTHER HAD BREAST CANCER- UNSURE OF AGE.  SCREENING.  AB ([id number]
NECK ROM)  PRIORS: [REDACTED], POSSIBLY PURGED?

TECHNIQUE

Bilateral craniocaudal and medial lateral oblique views were generated and reviewed with computer-
aided detection.

COMPARISONS

Wednesday June, 2013

FINDINGS

ACR Type 2:  25-50% There are scattered fibroglandular densities.

No concerning masses, calcifications, or interval changes are seen.

IMPRESSION

Stable mammograms. One year follow-up is recommended.

BI-RADS 1, NEGATIVE.

Tech Notes:

## 2022-04-29 IMAGING — CT STONE PROTOCOL(Adult)
2 of 3 series · 11 of 46 positions shown, 12 images · non-contrast
Comparison: No relevant prior studies available.

DIAGNOSTIC STUDIES

EXAM:  CT ABDOMEN AND PELVIS WITHOUT INTRAVENOUS CONTRAST  ([id number])
INDICATION: Left-sided flank pain rating into the groin.  Righ Left sided flank pain radiating to
groin. History of c-section, appendectomy, salpingectomy, and cholecystectomy. CT/NM 0/0. TB
TECHNIQUE: Axial computed tomography images of the abdomen and pelvis without intravenous contrast.

[Series 2: abdomen ax 2.00 br40 s3 · axial · 0.59mm/px · z∈[+1185,+1567]mm · 8 of 221 slices shown, 9 images]
[im 15/221  soft-tissue]
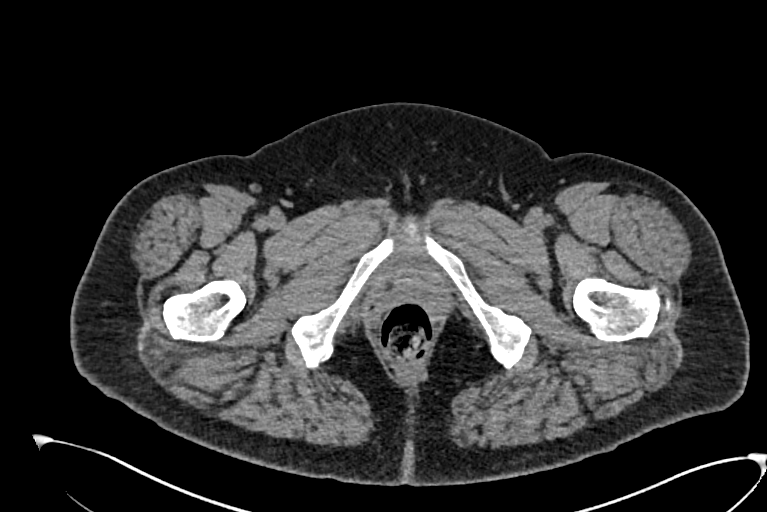
[im 15/221  bone]
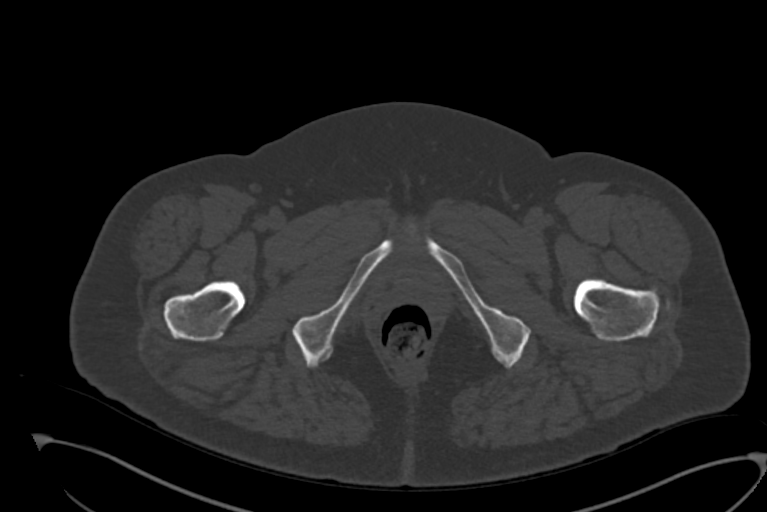
[im 43/221  soft-tissue]
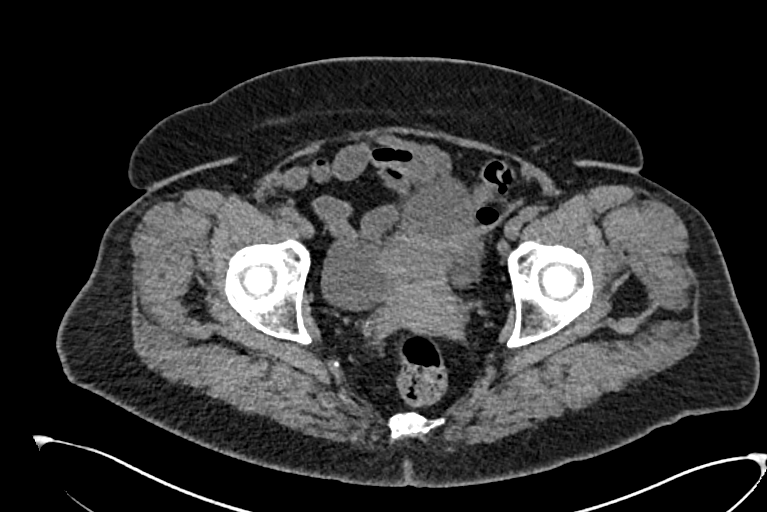
[im 71/221  soft-tissue]
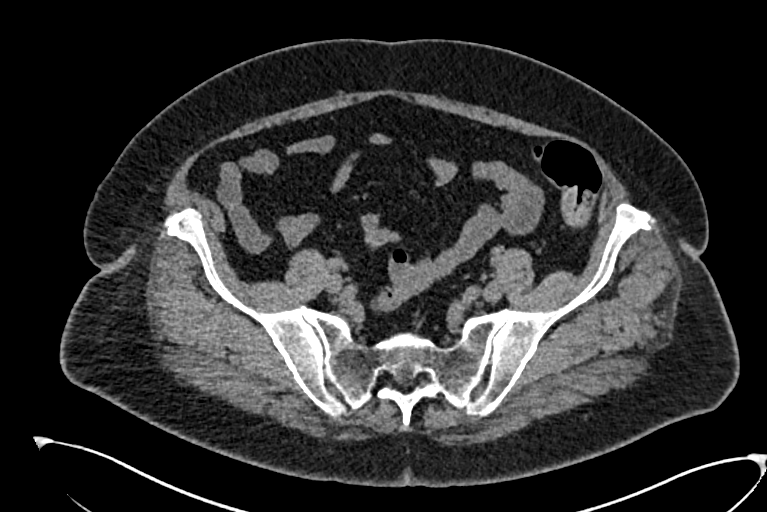
[im 100/221  soft-tissue]
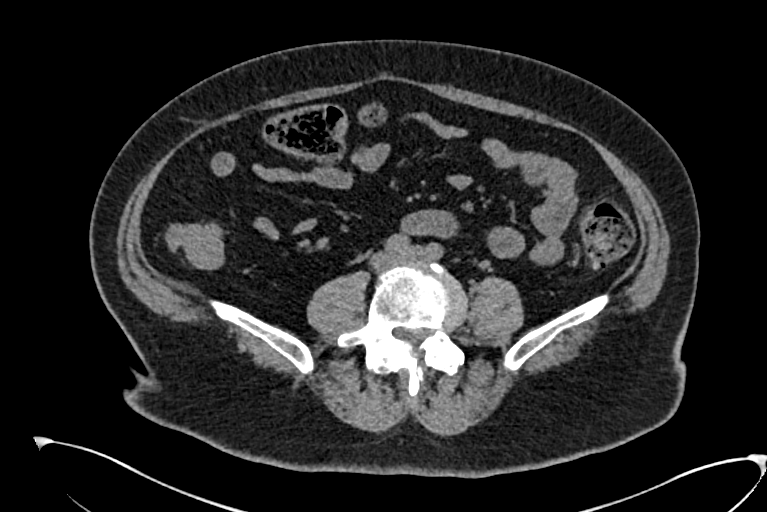
[im 121/221  soft-tissue]
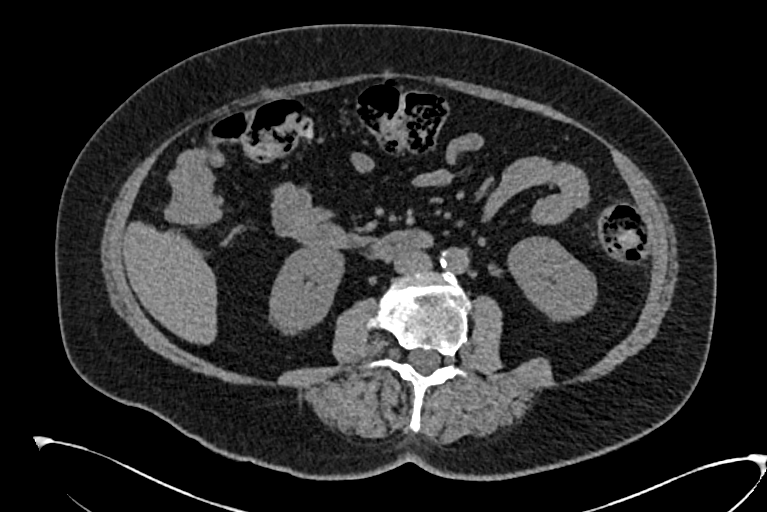
[im 150/221  soft-tissue]
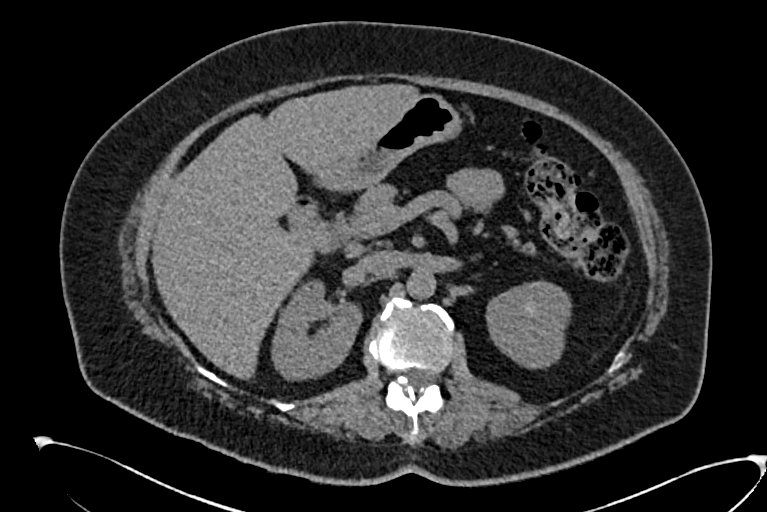
[im 178/221  soft-tissue]
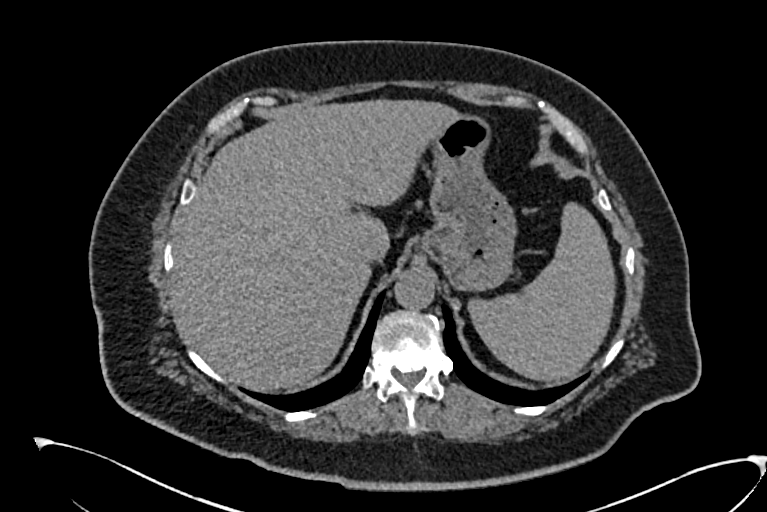
[im 206/221  soft-tissue]
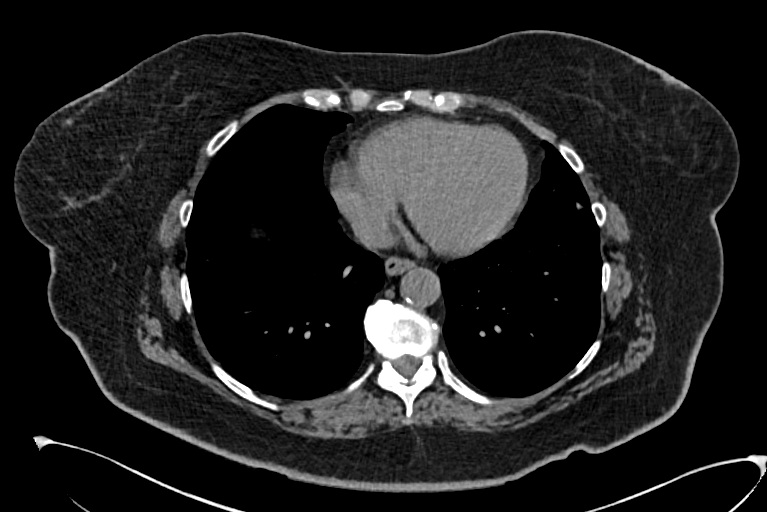

[Series 4: abdomen cor 2.00 br40 s3 · coronal · 0.87mm/px · 3 of 151 slices shown]
[im 51/151  soft-tissue]
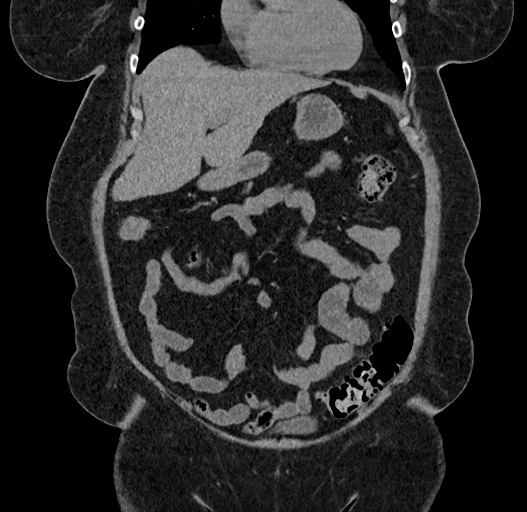
[im 67/151  soft-tissue]
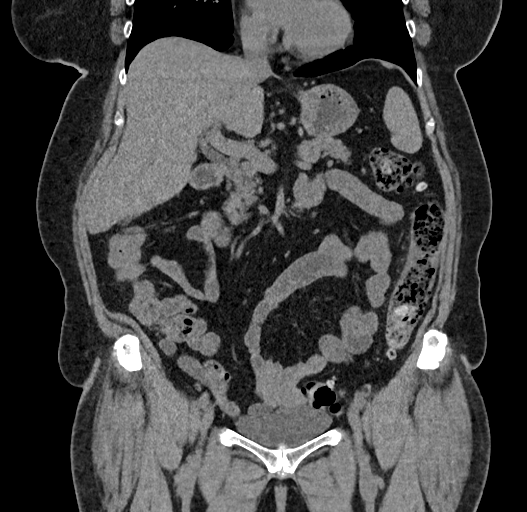
[im 84/151  soft-tissue]
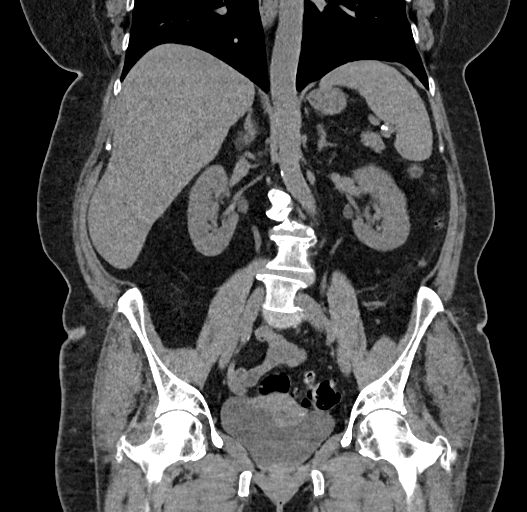

[11 of 46 positions shown; findings below may reference images not displayed]

All CT scans at this facility use dose modulation, interval reconstruction, and/or weight-based
dosing when appropriate to reduce radiation dose to as low as reasonably achievable.

Sagittal and coronal reformatted images were created and reviewed.

All CT scans at this facility use dose modulation, interval reconstruction, and/or weight-based
dosing when appropriate to reduce radiation dose to as low as reasonably achievable.

Number of previous computed tomography exams in the last 12 months is 0. Number of previous nuclear
medicine myocardial perfusion studies in the last 12 months is 0.
FINDINGS: LUNG BASES:  The lung bases demonstrate NO consolidations or pleural effusions.

Minimal linear fibrotic changes in the lingula and middle lobe.

ABDOMEN:

LIVER:  NO mass or abnormal enlargement.

GALLBLADDER AND BILE DUCTS:  Postsurgical changes from prior cholecystectomy.

PANCREAS:  NO edema or inflammatory changes. NO significant ductal dilatation. NO definite mass.

SPLEEN:  NO mass or abnormal enlargement.

ADRENALS:  NO mass or significant, abnormal enlargement.

KIDNEYS AND URETERS:  Minimal prominence of the RIGHT renal pelvis. NO intrarenal dilatation. NO
intrarenal nonobstructing stones.

URETERS: NO definite ureteral stones.

The LEFT kidney demonstrates NO hydronephrosis, obstructing stones or masses.

STOMACH AND BOWEL:  The stomach demonstrates NO abnormal thickening or distention.

NO abnormal thickening or distention of the small bowel.

Moderate diverticulitis with moderate increased attenuation in the pericolic fat in the mid distal
descending colon. NO focal fluid collection suggesting abscess.

Marked distal colonic diverticulosis.  NO specific CT evidence for acute diverticulitis.

Small amount of fecal material in the colon.

PELVIS:

APPENDIX:  The appendix is not definitely visualized.

BLADDER:  NO significant wall thickening or stones.

REPRODUCTIVE:  Uterus demonstrates NO gross, acute changes. NO ovarian masses or cysts greater than
3 cm.

ABDOMEN and PELVIS:

BONES/JOINTS:  NO acute compression fractures as visualized.

Moderate spondylosis/degenerative spinal changes.

Minimal degenerative changes in the hips.

SOFT TISSUES:  ABNORMAL FINDING, FURTHER EVALUATION RECOMMENDED: Partially visualized nodular
density in the central, deep RIGHT breast measuring approximately 8.4 mm.

VASCULATURE:  The aorta demonstrates NO aneurysmal distention or rupture.  There is minimal arterial
vascular calcification.

9.6 mm splenic artery aneurysms which is nearly completely calcified.

LYMPH NODES:  NO pathologically enlarged lymph nodes.
IMPRESSION: - CRITICAL FINDING: Colonic findings as described consistent with moderate diverticulitis. NO focal
fluid collection suggesting abscess.

- 9.6 mm splenic artery aneurysms which is nearly completely calcified.  Follow-up of splenic artery
aneurysms: Splenic artery aneurysms less <2 cm should be evaluated yearly. Aneurysms 2 cm should be
evaluated for endovascular or surgical therapy.

- ABNORMAL FINDING, FURTHER EVALUATION RECOMMENDED: Partially visualized nodular density in the
central, deep RIGHT breast measuring approximately 8.4 mm.  Correlation with any prior studies,
ultrasound and/or mammography should be obtained. If indicated, follow-up with ultrasound and
mammography should be considered.

- Small amount of fecal material in the colon.

- Incidental/non-acute findings are described above.

Tech Notes:

Left sided flank pain radiating to groin. History of c-section, appendectomy, salpingectomy, and
cholecystectomy. CT/NM 0/0. TB

## 2022-05-03 IMAGING — MG MM mammogram 3d dx bilat
8 series · 10 of 24 positions shown · non-contrast
Comparison: none

[R CC]
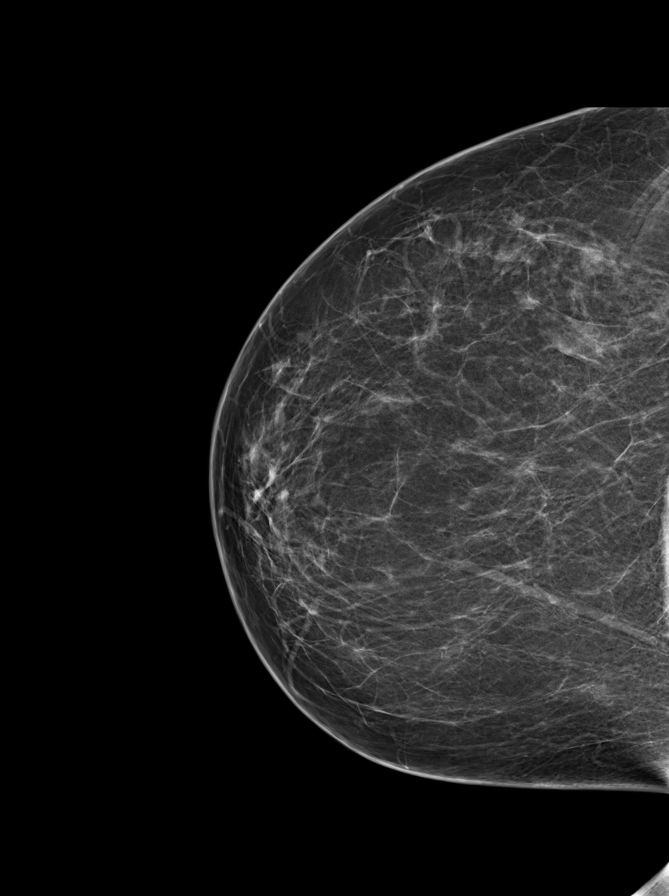

[Series 4: BTO_TOMO R-CC PRIME, EMPIRE_C.97/112, Di tomo · 3 of 72 frames shown]
[frame 24/72]
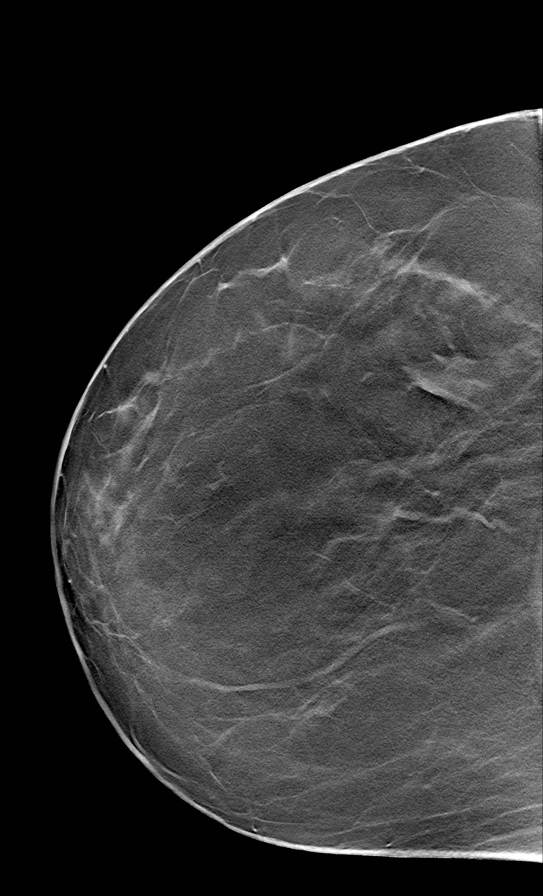
[frame 37/72]
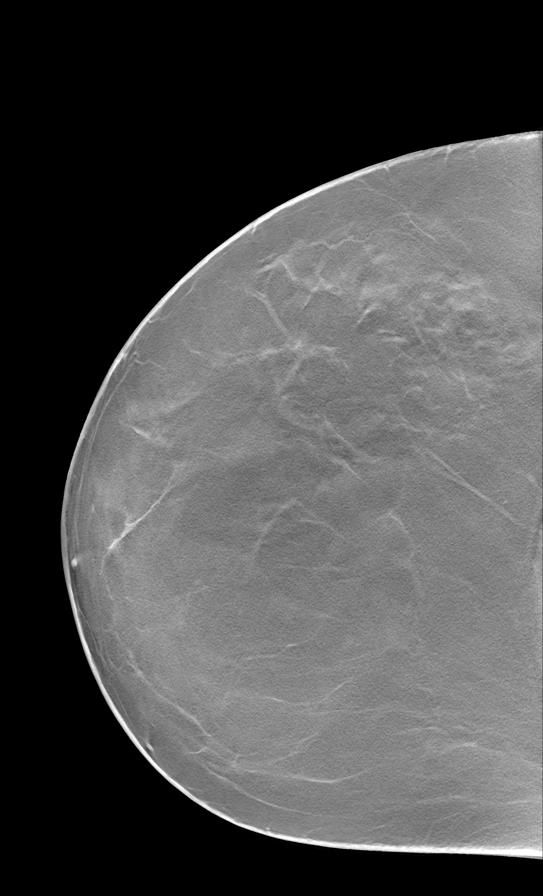
[frame 49/72]
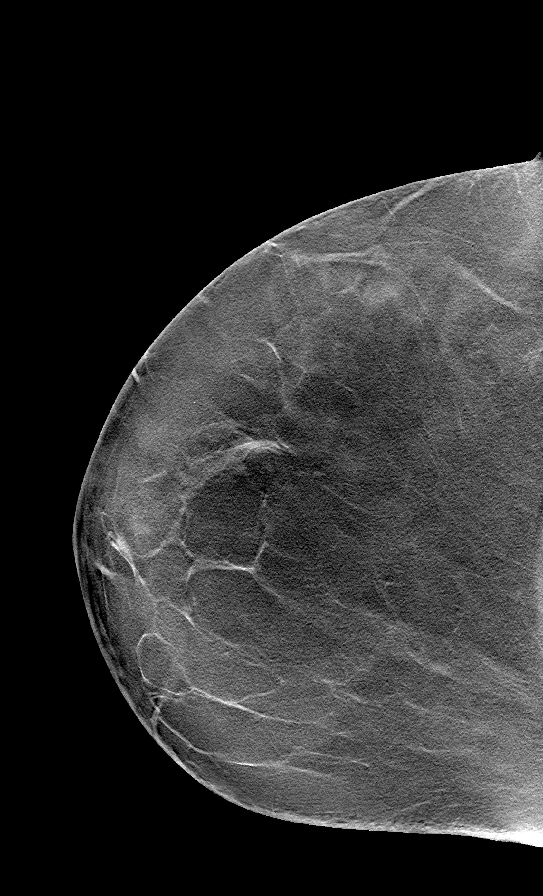

[L CC]
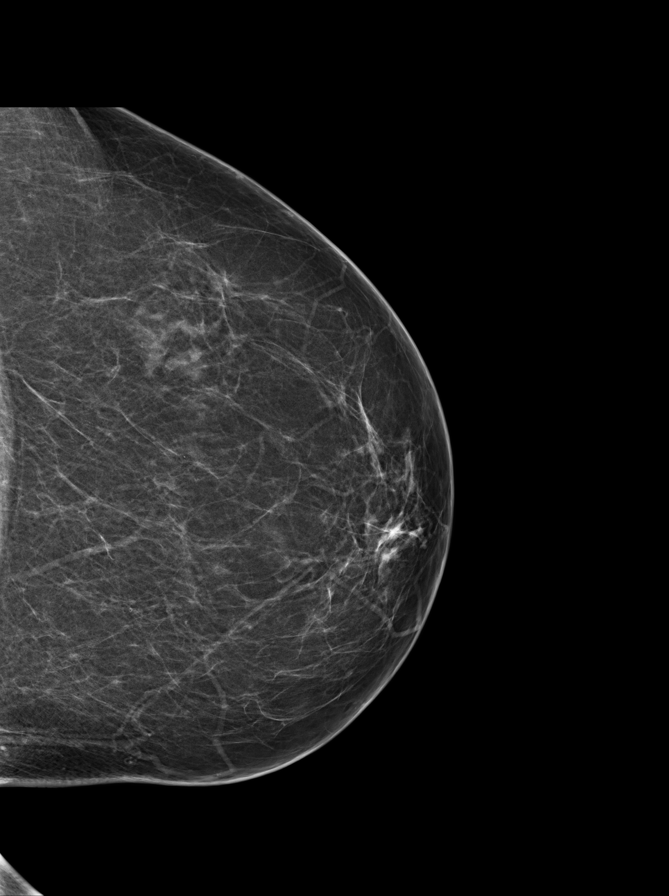

[BTO_TOMO L-CC PRIME, EMPIRE_C.97/112, Di tomo · tomo slice 37/73.0]
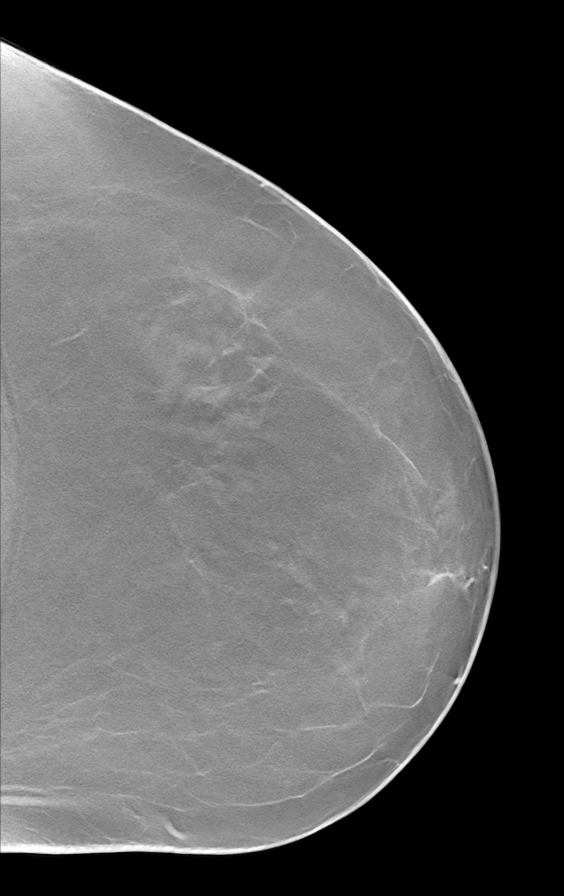

[R MLO]
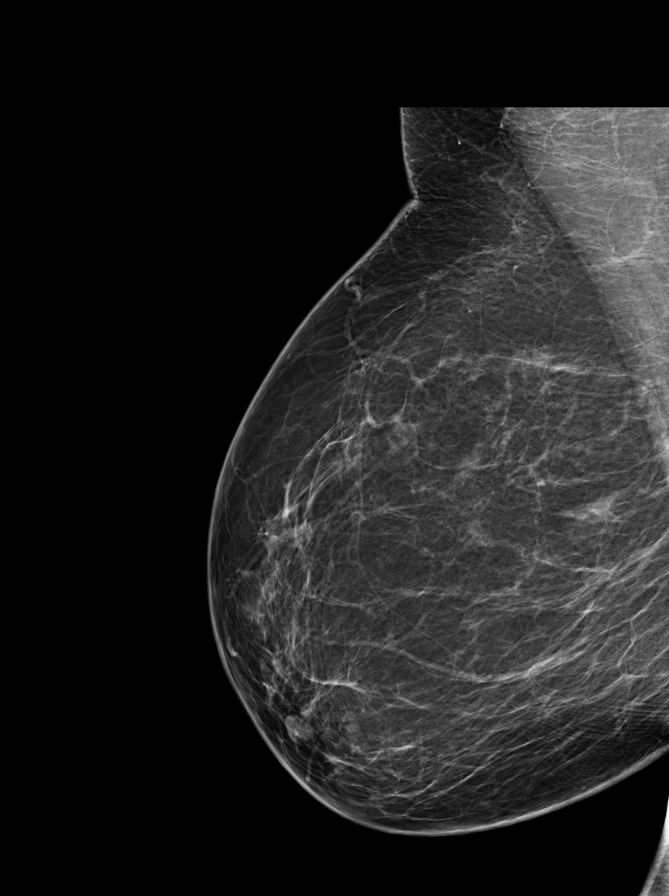

[BTO_TOMO R-MLO PRIME, EMPIRE_C.97/112, D tomo · tomo slice 40/79.0]
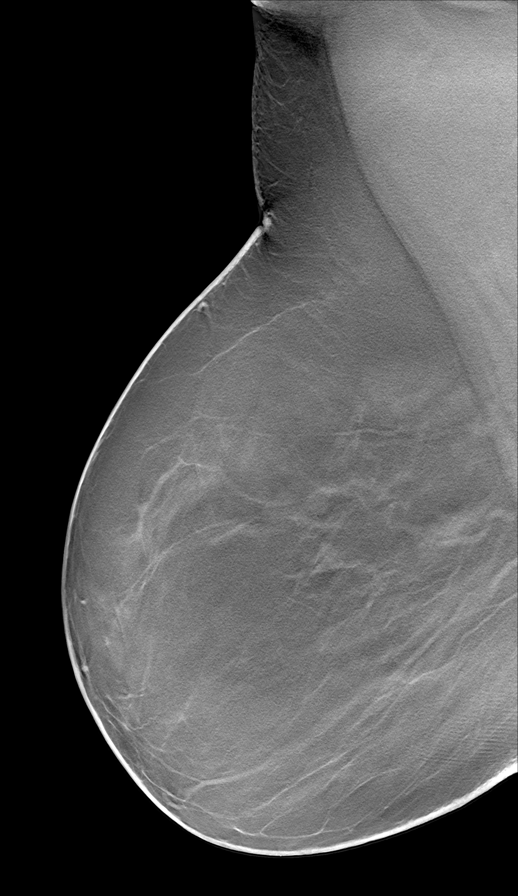

[L MLO]
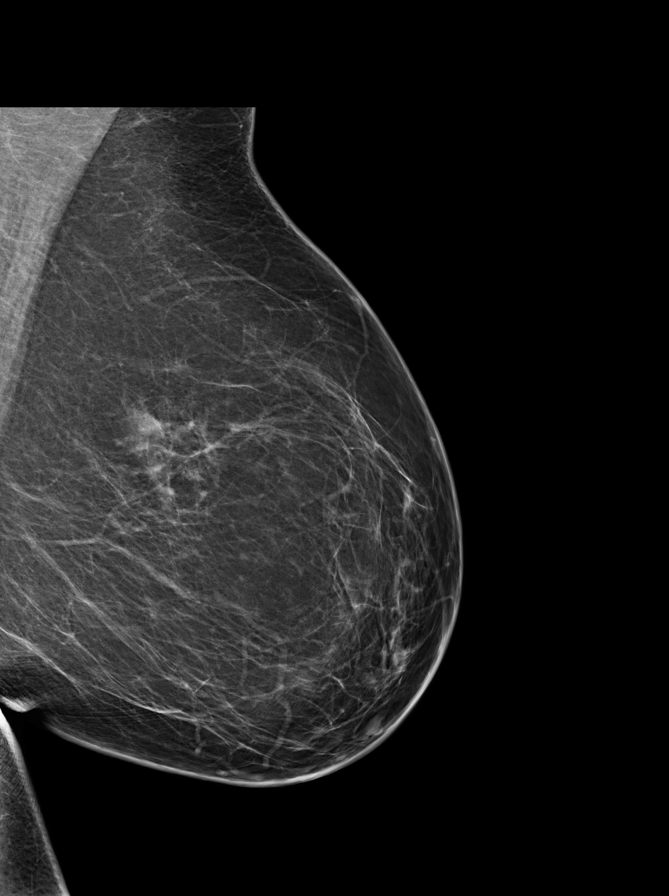

[BTO_TOMO L-MLO PRIME, EMPIRE_C.97/112, D tomo · tomo slice 39/77.0]
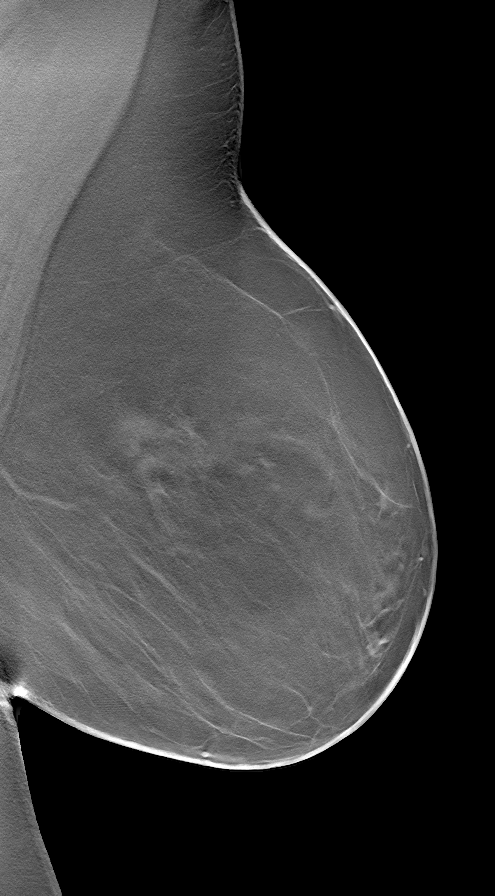

[10 of 24 positions shown; findings below may reference images not displayed]

EXAM

3D  diagnostic MAMMOGRAM, BILATERAL

INDICATION

ABNORMALITY SEEN ON RT BREAST IN RECENT CT SCAN
ABNORMALITY SEEN ON RECENT CT SCAN RT BREAST.  TC SCORE: LIFETIME RISK 3.8%.  DX BIL.  AB (3D)
PRIORS: [id number].

TECHNIQUE

Digital 2D CC and MLO projections obtained with 3D tomographic views per manufacturer's protocol.

COMPARISONS

February 17, 2021

FINDINGS

ACR Type 2:  25-50% There are scattered fibroglandular densities.

Concerning masses or calcifications are seen. Yearly screening mammography in Saturday April, 2023 is
recommended.

IMPRESSION

Stable bilateral mammography. Patient can return to yearly screening.

BI-RADS 1, NEGATIVE.

Tech Notes:
